# Patient Record
Sex: Male | Born: 1950
Health system: Southern US, Community
[De-identification: ages and names within clinical notes are randomized; demographics above are authoritative.]

## PROBLEM LIST (undated history)

## (undated) DIAGNOSIS — C61 Malignant neoplasm of prostate: Secondary | ICD-10-CM

## (undated) DIAGNOSIS — Z86010 Personal history of colonic polyps: Secondary | ICD-10-CM

## (undated) DIAGNOSIS — I10 Essential (primary) hypertension: Secondary | ICD-10-CM

## (undated) HISTORY — PX: INGUINAL HERNIA REPAIR: SUR1180

## (undated) HISTORY — PX: PROSTATE BIOPSY: SHX241

## (undated) HISTORY — DX: Essential (primary) hypertension: I10

## (undated) HISTORY — PX: COLONOSCOPY W/ POLYPECTOMY: SHX1380

## (undated) HISTORY — DX: Malignant neoplasm of prostate: C61

## (undated) HISTORY — DX: Personal history of colonic polyps: Z86.010

## (undated) HISTORY — PX: TONSILLECTOMY: SUR1361

---

## 2009-08-20 ENCOUNTER — Encounter (INDEPENDENT_AMBULATORY_CARE_PROVIDER_SITE_OTHER): Payer: Self-pay | Admitting: *Deleted

## 2009-08-22 ENCOUNTER — Encounter: Payer: Self-pay | Admitting: Internal Medicine

## 2009-09-13 ENCOUNTER — Ambulatory Visit: Payer: Self-pay | Admitting: Internal Medicine

## 2009-09-13 DIAGNOSIS — Z86010 Personal history of colonic polyps: Secondary | ICD-10-CM

## 2009-09-13 HISTORY — DX: Personal history of colonic polyps: Z86.010

## 2009-09-17 ENCOUNTER — Encounter: Payer: Self-pay | Admitting: Internal Medicine

## 2010-07-23 NOTE — Letter (Signed)
Summary: Gary Reid Instructions  Gary Reid  [address] [place], [place] [address]   Phone: [phone]  Fax: [phone]       Gary Reid    [birth year]    MRN: [id number]        Procedure Day Gary Reid: Gary Reid, 09/13/09     Arrival Time: 10:30 AM      Procedure Time: 11:30 AM     Location of Procedure:                    _X_   [facility] (4th Floor)                       PREPARATION FOR COLONOSCOPY WITH MOVIPREP   Starting 5 days prior to your procedure 09/08/09 do not eat nuts, seeds, popcorn, corn, beans, peas,  salads, or any raw vegetables.  Do not take any fiber supplements (e.g. Metamucil, Citrucel, and Benefiber).  THE DAY BEFORE YOUR PROCEDURE         DATE: 09/12/09       DAY: WEDNESDAY  1.  Drink clear liquids the entire day-NO SOLID FOOD  2.  Do not drink anything colored red or purple.  Avoid juices with pulp.  No orange juice.  3.  Drink at least 64 oz. (8 glasses) of fluid/clear liquids during the day to prevent dehydration and help the prep work efficiently.  CLEAR LIQUIDS INCLUDE: Water Jello Ice Popsicles Tea (sugar ok, no milk/cream) Powdered fruit flavored drinks Coffee (sugar ok, no milk/cream) Gatorade Juice: apple, white grape, white cranberry  Lemonade Clear bullion, consomm, broth Carbonated beverages (any kind) Strained chicken noodle soup Hard Candy                             4.  In the morning, mix first dose of MoviPrep solution:    Empty 1 Pouch A and 1 Pouch B into the disposable container    Add lukewarm drinking water to the top line of the container. Mix to dissolve    Refrigerate (mixed solution should be used within 24 hrs)  5.  Begin drinking the prep at 5:00 p.m. The MoviPrep container is divided by 4 marks.   Every 15 minutes drink the solution down to the next mark (approximately 8 oz) until the full liter is complete.   6.  Follow completed prep with 16 oz of clear liquid of your  choice (Nothing red or purple).  Continue to drink clear liquids until bedtime.  7.  Before going to bed, mix second dose of MoviPrep solution:    Empty 1 Pouch A and 1 Pouch B into the disposable container    Add lukewarm drinking water to the top line of the container. Mix to dissolve    Refrigerate  THE DAY OF YOUR PROCEDURE      DATE: 09/13/09     DAY: THURSDAY  Beginning at 6:30 a.m. (5 hours before procedure):         1. Every 15 minutes, drink the solution down to the next mark (approx 8 oz) until the full liter is complete.  2. Follow completed prep with 16 oz. of clear liquid of your choice.    3. You may drink clear liquids until 9:30 AM (2 HOURS BEFORE PROCEDURE).   MEDICATION INSTRUCTIONS  Unless otherwise instructed, you should take regular prescription medications with a small sip of water  as early as possible the morning of your procedure.                                   OTHER INSTRUCTIONS  You will need a responsible adult at least 62 years of age to accompany you and drive you home.   This person must remain in the waiting room during your procedure.  Wear loose fitting clothing that is easily removed.  Leave jewelry and other valuables at home.  However, you may wish to bring a book to read or  an iPod/MP3 player to listen to music as you wait for your procedure to start.  Remove all body piercing jewelry and leave at home.  Total time from sign-in until discharge is approximately 2-3 hours.  You should go home directly after your procedure and rest.  You can resume normal activities the  day after your procedure.  The day of your procedure you should not:   Drive   Make legal decisions   Operate machinery   Drink alcohol   Return to work  You will receive specific instructions about eating, activities and medications before you leave.    The above instructions have been reviewed and explained to me by    Gary Reid, CMA    I fully understand and can verbalize these instructions _____________________________ Date _________

## 2010-07-23 NOTE — Procedures (Signed)
Summary: Colonoscopy  Patient: Gary Reid Note: All result statuses are Final unless otherwise noted.  Tests: (1) Colonoscopy (COL)   COL Colonoscopy           DONE     [facility]     [address]     [place], [place]  [address]           COLONOSCOPY PROCEDURE REPORT           PATIENT:  [name], [name]  MR#:  [id number]     BIRTHDATE:  Mar 31, 1951, 58 yrs. old  GENDER:  male     ENDOSCOPIST:  Iva Boop, MD, [facility]     REF. BY:  Lupe Carney, M.D.     PROCEDURE DATE:  09/13/2009     PROCEDURE:  Colonoscopy with snare polypectomy     ASA CLASS:  Class II     INDICATIONS:  Routine Risk Screening     MEDICATIONS:   Fentanyl 50 mcg IV, Versed 7 mg IV           DESCRIPTION OF PROCEDURE:   After the risks benefits and     alternatives of the procedure were thoroughly explained, informed     consent was obtained.  Digital rectal exam was performed and     revealed an enlarged prostate.  Mildly enlarged and smooth. The LB     CF-H180AL [id number] endoscope was introduced through the anus and     advanced to the cecum, which was identified by both the appendix     and ileocecal valve, without limitations.  The quality of the prep     was excellent, using MoviPrep.  The instrument was then slowly     withdrawn as the colon was fully examined.     <<PROCEDUREIMAGES>>           FINDINGS:  There were multiple polyps identified and removed. Nine     (9) polyps removed. Size range 3mm to 14-15 mm. Two ascending,     three hepatic flexure, one splenic flexure and three descending     polyps. Polyps > 7mm  were snared, then cauterized with monopolar     cautery. Retrieval was successful. Smaller polyps were snared     without cautery. Retrieval was successful.  This was otherwise a     normal examination of the colon.   Retroflexed views in the rectum     revealed no abnormalities.    The scope was then withdrawn from     the patient and the procedure completed.        Insertion time (including polypectomies) 7:40 minutes Withdrawal     time: 13:48 minutes           COMPLICATIONS:  None           ENDOSCOPIC IMPRESSION:     1) Polyps, multiple (9 removed 3mm to 14-15 mm size)     2) Otherwise normal examination, excellent prep           RECOMMENDATIONS:     1) No aspirin or NSAID's for 2 weeks           REPEAT EXAM:  In for Colonoscopy, pending biopsy results.           Iva Boop, MD, [facility]           CC:  Lupe Carney, MD     The Patient           n.  eSIGNED:   Iva Boop at 09/13/2009 12:10 PM           [name], [id number]  Note: An exclamation mark (!) indicates a result that was not dispersed into the flowsheet. Document Creation Date: 09/13/2009 12:11 PM _______________________________________________________________________  (1) Order result status: Final Collection or observation date-time: 09/13/2009 11:59 Requested date-time:  Receipt date-time:  Reported date-time:  Referring Physician:   Ordering Physician: Stan Head [phone]) Specimen Source:  Source: Launa Grill Order Number: [id number] Lab site:   Appended Document: Colonoscopy     Procedures Next Due Date:    Colonoscopy: 09/2012

## 2010-07-23 NOTE — Letter (Signed)
Summary: Patient Notice- Polyp Results  [facility]  [address] [place], [place] [address]   Phone: [phone]  Fax: [phone]        September 17, 2009 MRN: [id number]    [facility] [address] [place], [place]  [address]    Dear Mr. FAULKNER:  The polyps removed from your colon were adenomatous. This means that they were pre-cancerous or that  they had the potential to change into cancer over time.   I recommend that you have a repeat colonoscopy in 3 years to determine if you have developed any new polyps over time. If you develop any new rectal bleeding, abdominal pain or significant bowel habit changes, please contact us before then.  In addition to repeating colonoscopy, changing health habits may reduce your risk of having more colon polyps and possibly, colon cancer. You may lower your risk of future polyps and colon cancer by adopting healthy habits such as not smoking or using tobacco (if you do), being physically active, losing weight (if overweight), and eating a diet which includes fruits and vegetables and limits red meat.  Please call us if you are having persistent problems or have questions about your condition that have not been fully answered at this time.  Sincerely,  Iva Boop MD, [facility]  This letter has been electronically signed by your physician.  Appended Document: Patient Notice- Polyp Results letter mailed 3.28.11

## 2010-07-23 NOTE — Miscellaneous (Signed)
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 - Signed Rx of MOVIPREP 100 GM  SOLR (PEG-KCL-NACL-NASULF-NA ASC-C) As per prep instructions.;  #1 x 0;  Signed;  Entered by: Francee Piccolo CMA (AAMA);  Authorized by: Iva Boop MD, FACG;  Method used: [facility] [address] [phone]*, [address] [place], [place], [place]  [address], Ph: [phone] or [phone], Fax: [phone] Orders: Added new Test order of Colonoscopy (Colon) - Signed    Prescriptions: MOVIPREP 100 GM  SOLR (PEG-KCL-NACL-NASULF-NA ASC-C) As per prep instructions.  #1 x 0   Entered by:   Francee Piccolo CMA (AAMA)   Authorized by:   Iva Boop MD, [facility]   Signed by:   Francee Piccolo CMA (AAMA) on 08/22/2009   Method used:   [facility]        [facility] [address] [phone]* (retail)       [address]       [place], [place]  [address]       Ph: [phone] or [phone]       Fax: [phone]   RxID:   [id number]

## 2012-08-12 ENCOUNTER — Other Ambulatory Visit (HOSPITAL_COMMUNITY): Payer: Self-pay | Admitting: Urology

## 2012-08-17 ENCOUNTER — Encounter (HOSPITAL_COMMUNITY): Admission: RE | Admit: 2012-08-17 | Payer: 59 | Source: Ambulatory Visit

## 2012-08-20 ENCOUNTER — Ambulatory Visit (HOSPITAL_COMMUNITY)
Admission: RE | Admit: 2012-08-20 | Discharge: 2012-08-20 | Disposition: A | Discharge status: Home / Self Care | Payer: 59 | Source: Ambulatory Visit | Attending: Urology | Admitting: Urology

## 2012-08-20 DIAGNOSIS — C61 Malignant neoplasm of prostate: Secondary | ICD-10-CM | POA: Insufficient documentation

## 2012-08-20 LAB — POCT I-STAT, CHEM 8
BUN: 11 mg/dL (ref 6–23)
Calcium, Ion: 1.23 mmol/L (ref 1.13–1.30)
HCT: 51 % (ref 39.0–52.0)
Sodium: 141 mEq/L (ref 135–145)
TCO2: 30 mmol/L (ref 0–100)

## 2012-08-20 MED ORDER — GADOBENATE DIMEGLUMINE 529 MG/ML IV SOLN
20.0000 mL | Freq: Once | INTRAVENOUS | Status: AC | PRN
  Administered 2012-08-20: 20 mL via INTRAVENOUS

## 2012-08-26 ENCOUNTER — Encounter: Payer: Self-pay | Admitting: Internal Medicine

## 2012-08-27 ENCOUNTER — Encounter: Payer: Self-pay | Admitting: Internal Medicine

## 2012-08-27 DIAGNOSIS — C61 Malignant neoplasm of prostate: Secondary | ICD-10-CM

## 2012-08-27 HISTORY — DX: Malignant neoplasm of prostate: C61

## 2012-09-03 ENCOUNTER — Encounter: Payer: Self-pay | Admitting: Internal Medicine

## 2012-09-07 ENCOUNTER — Ambulatory Visit (AMBULATORY_SURGERY_CENTER): Payer: 59

## 2012-09-07 VITALS — Ht 68.0 in | Wt 232.4 lb

## 2012-09-07 DIAGNOSIS — Z1211 Encounter for screening for malignant neoplasm of colon: Secondary | ICD-10-CM

## 2012-09-07 MED ORDER — NA SULFATE-K SULFATE-MG SULF 17.5-3.13-1.6 GM/177ML PO SOLN: 1.0000 | Freq: Once | ORAL | Status: DC

## 2012-09-14 ENCOUNTER — Encounter: Payer: 59 | Admitting: Internal Medicine

## 2012-09-22 ENCOUNTER — Encounter: Payer: Self-pay | Admitting: Internal Medicine

## 2012-09-22 ENCOUNTER — Ambulatory Visit (AMBULATORY_SURGERY_CENTER): Payer: 59 | Admitting: Internal Medicine

## 2012-09-22 VITALS — BP 119/80 | HR 45 | Temp 98.4°F | Resp 21 | Ht 68.0 in | Wt 232.0 lb

## 2012-09-22 DIAGNOSIS — D126 Benign neoplasm of colon, unspecified: Secondary | ICD-10-CM

## 2012-09-22 DIAGNOSIS — Z1211 Encounter for screening for malignant neoplasm of colon: Secondary | ICD-10-CM

## 2012-09-22 DIAGNOSIS — Z86010 Personal history of colonic polyps: Secondary | ICD-10-CM

## 2012-09-22 MED ORDER — SODIUM CHLORIDE 0.9 % IV SOLN: 500.0000 mL | INTRAVENOUS | Status: DC

## 2012-09-22 NOTE — Progress Notes (Signed)
Patient did not experience any of the following events: a burn prior to discharge; a fall within the facility; wrong site/side/patient/procedure/implant event; or a hospital transfer or hospital admission upon discharge from the facility. (G8907) Patient did not have preoperative order for IV antibiotic SSI prophylaxis. (G8918)  

## 2012-09-22 NOTE — Progress Notes (Signed)
Called to room to assist during endoscopic procedure.  Patient ID and intended procedure confirmed with present staff. Received instructions for my participation in the procedure from the performing physician.  

## 2012-09-22 NOTE — Patient Instructions (Addendum)
I found and removed 7 polyps this time. Largest 8 mm. They all look benign but I think you will need to return again in 3 years for a repeat exam.  I also need to look into the posibiliity of genetic testing for polyposis sybndome and will let you know.  Thank you for choosing me and [facility].  Iva Boop, MD, FACG   YOU HAD AN ENDOSCOPIC PROCEDURE TODAY AT THE [facility]: Refer to the procedure report that was given to you for any specific questions about what was found during the examination.  If the procedure report does not answer your questions, please call your gastroenterologist to clarify.  If you requested that your care partner not be given the details of your procedure findings, then the procedure report has been included in a sealed envelope for you to review at your convenience later.  YOU SHOULD EXPECT: Some feelings of bloating in the abdomen. Passage of more gas than usual.  Walking can help get rid of the air that was put into your GI tract during the procedure and reduce the bloating. If you had a lower endoscopy (such as a colonoscopy or flexible sigmoidoscopy) you may notice spotting of blood in your stool or on the toilet paper. If you underwent a bowel prep for your procedure, then you may not have a normal bowel movement for a few days.  DIET: Your first meal following the procedure should be a light meal and then it is ok to progress to your normal diet.  A half-sandwich or bowl of soup is an example of a good first meal.  Heavy or fried foods are harder to digest and may make you feel nauseous or bloated.  Likewise meals heavy in dairy and vegetables can cause extra gas to form and this can also increase the bloating.  Drink plenty of fluids but you should avoid alcoholic beverages for 24 hours.  ACTIVITY: Your care partner should take you home directly after the procedure.  You should plan to take it easy, moving slowly for the rest of the  day.  You can resume normal activity the day after the procedure however you should NOT DRIVE or use heavy machinery for 24 hours (because of the sedation medicines used during the test).    SYMPTOMS TO REPORT IMMEDIATELY: A gastroenterologist can be reached at any hour.  During normal business hours, 8:30 AM to 5:00 PM Monday through Friday, call [phone].  After hours and on weekends, please call the GI answering service at [phone] who will take a message and have the physician on call contact you.   Following lower endoscopy (colonoscopy or flexible sigmoidoscopy):  Excessive amounts of blood in the stool  Significant tenderness or worsening of abdominal pains  Swelling of the abdomen that is new, acute  Fever of 100F or higher  FOLLOW UP: If any biopsies were taken you will be contacted by phone or by letter within the next 1-3 weeks.  Call your gastroenterologist if you have not heard about the biopsies in 3 weeks.  Our staff will call the home number listed on your records the next business day following your procedure to check on you and address any questions or concerns that you may have at that time regarding the information given to you following your procedure. This is a courtesy call and so if there is no answer at the home number and we have not heard from you through the  emergency physician on call, we will assume that you have returned to your regular daily activities without incident.  SIGNATURES/CONFIDENTIALITY: You and/or your care partner have signed paperwork which will be entered into your electronic medical record.  These signatures attest to the fact that that the information above on your After Visit Summary has been reviewed and is understood.  Full responsibility of the confidentiality of this discharge information lies with you and/or your care-partner.

## 2012-09-22 NOTE — Progress Notes (Signed)
Lidocaine-40mg IV prior to Propofol InductionPropofol given over incremental dosages 

## 2012-09-22 NOTE — Progress Notes (Signed)
NO EGG OR SOY ALLERGY. EWM 

## 2012-09-23 ENCOUNTER — Telehealth: Payer: Self-pay | Admitting: *Deleted

## 2012-09-23 NOTE — Telephone Encounter (Signed)
  Follow up Call-  Call back number 09/22/2012  Post procedure Call Back phone  # [phone]  Permission to leave phone message Yes     Patient questions:  Do you have a fever, pain , or abdominal swelling? no Pain Score  0 *  Have you tolerated food without any problems? yes  Have you been able to return to your normal activities? yes  Do you have any questions about your discharge instructions: Diet   no Medications  no Follow up visit  no  Do you have questions or concerns about your Care? no  Actions: * If pain score is 4 or above: No action needed, pain <4.

## 2012-09-24 NOTE — Op Note (Signed)
[facility] [address] [place], [address]   COLONOSCOPY PROCEDURE REPORT  PATIENT: Gary Reid, Gary Reid  MR#: [id number] BIRTHDATE: 1951-03-06 , 61  yrs. old GENDER: Male ENDOSCOPIST: Iva Boop, MD, [facility] PROCEDURE DATE:  09/22/2012 PROCEDURE:   Colonoscopy with biopsy and snare polypectomy ASA CLASS:   Class II INDICATIONS:Screening and surveillance,personal history of colonic polyps. MEDICATIONS: propofol (Diprivan) 400mg  IV, MAC sedation, administered by CRNA, and These medications were titrated to patient response per physician's verbal order  DESCRIPTION OF PROCEDURE:   After the risks benefits and alternatives of the procedure were thoroughly explained, informed consent was obtained.  A digital rectal exam revealed an enlarged prostate.   The LB CF-Q180AL [id number]  endoscope was introduced through the anus and advanced to the cecum, which was identified by both the appendix and ileocecal valve. No adverse events experienced.   The quality of the prep was Suprep excellent  The instrument was then slowly withdrawn as the colon was fully examined.      COLON FINDINGS: Seven polypoid shaped sessile polyps ranging between 3-38mm in size were found at the cecum (piecemeal snare), in the ascending colon (piecemeal snare x 1, intact snare x 1 and biopsy removal x 2), transverse colon (intact snare x1), and descending colon (bx x 1)  A polypectomy was performed with cold forceps on 3 diminutive polyps and with a cold snare on the others.   The resection was complete and the polyp tissue was completely retrieved.   The colon mucosa was otherwise normal.   A right colon retroflexion was performed.  Retroflexed views revealed no abnormalities. The time to cecum=2 minutes 45 seconds.  Withdrawal time=26 minutes 30 seconds.  The scope was withdrawn and the procedure completed. COMPLICATIONS: There were no complications.   ENDOSCOPIC IMPRESSION: 1.   Seven  sessile polyps ranging between 3-2mm in size were found at the cecum, in the ascending colon, transverse colon, and descending colon; polypectomy was performed with cold forceps and with a cold snare 2.   The colon mucosa was otherwise normal  RECOMMENDATIONS: Timing of repeat colonoscopy will be determined by pathology findings in this patient with 9 adenomas removed 2011 (largest 15 mm).   eSigned:  Iva Boop, MD, [facility] 09/24/2012 8:42 AM Revised: 09/24/2012 8:42 AM  cc: Lupe Carney, MD and The Patient   PATIENT NAME:  Gary Reid, Gary Reid MR#: [id number]

## 2012-09-27 ENCOUNTER — Encounter: Payer: Self-pay | Admitting: Internal Medicine

## 2012-09-27 NOTE — Progress Notes (Signed)
Quick Note:  7 adenomas/sessile serrated adenomas Total of 16 adenomas in 3 years  Will plan for genetic testing, ? Attenuated FAP or other polyposis  To call from office to set this up and will still send letter  1 year recall for now - may change depending upon genetics results ______

## 2012-09-28 ENCOUNTER — Other Ambulatory Visit: Payer: Self-pay

## 2013-09-29 ENCOUNTER — Encounter: Payer: Self-pay | Admitting: Internal Medicine

## 2014-03-09 ENCOUNTER — Encounter: Payer: Self-pay | Admitting: Internal Medicine

## 2014-12-07 ENCOUNTER — Other Ambulatory Visit: Payer: Self-pay | Admitting: Urology

## 2014-12-07 DIAGNOSIS — C61 Malignant neoplasm of prostate: Secondary | ICD-10-CM

## 2014-12-27 ENCOUNTER — Ambulatory Visit (HOSPITAL_COMMUNITY)
Admission: RE | Admit: 2014-12-27 | Discharge: 2014-12-27 | Disposition: A | Discharge status: Home / Self Care | Payer: Medicare Other | Source: Ambulatory Visit | Attending: Urology | Admitting: Urology

## 2014-12-27 DIAGNOSIS — C61 Malignant neoplasm of prostate: Secondary | ICD-10-CM | POA: Insufficient documentation

## 2014-12-27 LAB — POCT I-STAT CREATININE: Creatinine, Ser: 0.9 mg/dL (ref 0.61–1.24)

## 2014-12-27 MED ORDER — GADOBENATE DIMEGLUMINE 529 MG/ML IV SOLN
20.0000 mL | Freq: Once | INTRAVENOUS | Status: AC | PRN
  Administered 2014-12-27: 20 mL via INTRAVENOUS

## 2015-03-06 ENCOUNTER — Other Ambulatory Visit: Payer: Self-pay | Admitting: Internal Medicine

## 2015-03-06 DIAGNOSIS — Z86010 Personal history of colonic polyps: Secondary | ICD-10-CM

## 2015-03-12 ENCOUNTER — Ambulatory Visit (AMBULATORY_SURGERY_CENTER): Payer: Medicare Other | Admitting: Internal Medicine

## 2015-03-12 ENCOUNTER — Encounter: Payer: Self-pay | Admitting: Internal Medicine

## 2015-03-12 VITALS — BP 118/59 | HR 52 | Temp 97.8°F | Resp 72 | Ht 68.0 in | Wt 230.0 lb

## 2015-03-12 DIAGNOSIS — D129 Benign neoplasm of anus and anal canal: Secondary | ICD-10-CM

## 2015-03-12 DIAGNOSIS — D122 Benign neoplasm of ascending colon: Secondary | ICD-10-CM | POA: Diagnosis not present

## 2015-03-12 DIAGNOSIS — K635 Polyp of colon: Secondary | ICD-10-CM | POA: Diagnosis not present

## 2015-03-12 DIAGNOSIS — D128 Benign neoplasm of rectum: Secondary | ICD-10-CM

## 2015-03-12 DIAGNOSIS — K621 Rectal polyp: Secondary | ICD-10-CM

## 2015-03-12 DIAGNOSIS — D123 Benign neoplasm of transverse colon: Secondary | ICD-10-CM

## 2015-03-12 DIAGNOSIS — Z86010 Personal history of colonic polyps: Secondary | ICD-10-CM | POA: Diagnosis present

## 2015-03-12 DIAGNOSIS — D125 Benign neoplasm of sigmoid colon: Secondary | ICD-10-CM

## 2015-03-12 MED ORDER — SODIUM CHLORIDE 0.9 % IV SOLN: 500.0000 mL | INTRAVENOUS | Status: DC

## 2015-03-12 NOTE — Progress Notes (Signed)
Transferred to recovery room. A/O x3, pleased with MAC.  VSS.  Report to Suzanne, RN. 

## 2015-03-12 NOTE — Patient Instructions (Addendum)
I found and removed 8 small polyps. I will let you know pathology results and when to have another routine colonoscopy by mail.  I appreciate the opportunity to care for you. Gatha Mayer, MD, FACG   YOU HAD AN ENDOSCOPIC PROCEDURE TODAY AT [facility]:   Refer to the procedure report that was given to you for any specific questions about what was found during the examination.  If the procedure report does not answer your questions, please call your gastroenterologist to clarify.  If you requested that your care partner not be given the details of your procedure findings, then the procedure report has been included in a sealed envelope for you to review at your convenience later.  YOU SHOULD EXPECT: Some feelings of bloating in the abdomen. Passage of more gas than usual.  Walking can help get rid of the air that was put into your GI tract during the procedure and reduce the bloating. If you had a lower endoscopy (such as a colonoscopy or flexible sigmoidoscopy) you may notice spotting of blood in your stool or on the toilet paper. If you underwent a bowel prep for your procedure, you may not have a normal bowel movement for a few days.  Please Note:  You might notice some irritation and congestion in your nose or some drainage.  This is from the oxygen used during your procedure.  There is no need for concern and it should clear up in a day or so.  SYMPTOMS TO REPORT IMMEDIATELY:   Following lower endoscopy (colonoscopy or flexible sigmoidoscopy):  Excessive amounts of blood in the stool  Significant tenderness or worsening of abdominal pains  Swelling of the abdomen that is new, acute  Fever of 100F or higher   For urgent or emergent issues, a gastroenterologist can be reached at any hour by calling [phone].   DIET: Your first meal following the procedure should be a small meal and then it is ok to progress to your normal diet. Heavy or fried foods are  harder to digest and may make you feel nauseous or bloated.  Likewise, meals heavy in dairy and vegetables can increase bloating.  Drink plenty of fluids but you should avoid alcoholic beverages for 24 hours.  ACTIVITY:  You should plan to take it easy for the rest of today and you should NOT DRIVE or use heavy machinery until tomorrow (because of the sedation medicines used during the test).    FOLLOW UP: Our staff will call the number listed on your records the next business day following your procedure to check on you and address any questions or concerns that you may have regarding the information given to you following your procedure. If we do not reach you, we will leave a message.  However, if you are feeling well and you are not experiencing any problems, there is no need to return our call.  We will assume that you have returned to your regular daily activities without incident.  If any biopsies were taken you will be contacted by phone or by letter within the next 1-3 weeks.  Please call us at [phone] if you have not heard about the biopsies in 3 weeks.    SIGNATURES/CONFIDENTIALITY: You and/or your care partner have signed paperwork which will be entered into your electronic medical record.  These signatures attest to the fact that that the information above on your After Visit Summary has been reviewed and is understood.  Full responsibility of the confidentiality of this discharge information lies with you and/or your care-partner.

## 2015-03-12 NOTE — Progress Notes (Signed)
Called to room to assist during endoscopic procedure.  Patient ID and intended procedure confirmed with present staff. Received instructions for my participation in the procedure from the performing physician.  

## 2015-03-12 NOTE — Op Note (Signed)
[place]  [address] [place], [id number]   COLONOSCOPY PROCEDURE REPORT  PATIENT: Gary Reid, Gary Reid  MR#: [id number] BIRTHDATE: Feb 04, 1951 , 55  yrs. old GENDER: male ENDOSCOPIST: Gatha Mayer, MD, [facility] PROCEDURE DATE:  03/12/2015 PROCEDURE:   Colonoscopy, surveillance and Colonoscopy with snare polypectomy First Screening Colonoscopy - Avg.  risk and is 50 yrs.  old or older - No.  Prior Negative Screening - Now for repeat screening. N/A  History of Adenoma - Now for follow-up colonoscopy & has been > or = to 3 yrs.  Yes hx of adenoma.  Has been 3 or more years since last colonoscopy.  Polyps removed today? Yes ASA CLASS:   Class II INDICATIONS:Surveillance due to prior colonic neoplasia and PH Colon Adenoma. MEDICATIONS: Propofol 325 mg IV and Monitored anesthesia care DESCRIPTION OF PROCEDURE:   After the risks benefits and alternatives of the procedure were thoroughly explained, informed consent was obtained.  The digital rectal exam revealed no rectal mass and revealed an enlarged prostate.   The [id number] endoscope was introduced through the anus and advanced to the cecum, which was identified by both the appendix and ileocecal valve. No adverse events experienced.   The quality of the prep was good.  (MiraLax was used)  The instrument was then slowly withdrawn as the colon was fully examined. Estimated blood loss is zero unless otherwise noted in this procedure report.  COLON FINDINGS: Eight polypoid shaped sessile polyps ranging from 2 to 62mm in size were found in the transverse colon, ascending colon, rectum, and sigmoid colon.  Polypectomies were performed with a cold snare.  The resection was complete, the polyp tissue was completely retrieved and sent to histology.   The examination was otherwise normal.  Retroflexed views revealed no abnormalities. The time to cecum = 1.3 Withdrawal time = 17.2   The scope was withdrawn and the  procedure completed. COMPLICATIONS: There were no immediate complications. ENDOSCOPIC IMPRESSION: 1.   Eight sessile polyps ranging from 2 to 68mm in size were found in the transverse colon, ascending colon, rectum, and sigmoid colon; polypectomies were performed with a cold snare 2.   The examination was otherwise normal - hx numerous adenomas since intital screening  RECOMMENDATIONS: Timing of repeat colonoscopy will be determined by pathology findings. eSigned:  Gatha Mayer, MD, [facility] 03/12/2015 8:40 AM revcc: The Patient  and Donnie Coffin, MD

## 2015-03-13 ENCOUNTER — Telehealth: Payer: Self-pay | Admitting: *Deleted

## 2015-03-13 NOTE — Telephone Encounter (Signed)
  Follow up Call-  Call back number 03/12/2015 09/22/2012  Post procedure Call Back phone  # [phone]  Permission to leave phone message Yes Yes     Patient questions:  Do you have a fever, pain , or abdominal swelling? No. Pain Score  0 *  Have you tolerated food without any problems? Yes.    Have you been able to return to your normal activities? Yes.    Do you have any questions about your discharge instructions: Diet   No. Medications  No. Follow up visit  No.  Do you have questions or concerns about your Care? No.  Actions: * If pain score is 4 or above: No action needed, pain <4.

## 2015-03-20 ENCOUNTER — Encounter: Payer: Self-pay | Admitting: Internal Medicine

## 2015-03-20 DIAGNOSIS — Z86010 Personal history of colonic polyps: Secondary | ICD-10-CM

## 2015-03-20 NOTE — Progress Notes (Signed)
Quick Note:  3 adenomas and an ssp Repeat colonoscopy 2019 ______

## 2015-07-30 MED FILL — VERAPAMIL ER 240 MG TABLET: 240 | 90 days supply | Qty: 90 | Fill #1

## 2015-08-23 MED FILL — METOPROLOL TARTRATE 100 MG: 100 | 90 days supply | Qty: 90 | Fill #0

## 2015-09-12 MED FILL — LOSARTAN-HCTZ 100-25 MG TAB: 100-25 | 90 days supply | Qty: 90 | Fill #2

## 2015-10-25 MED FILL — VERAPAMIL ER 240 MG TABLET: 240 | 90 days supply | Qty: 90 | Fill #0

## 2015-11-20 MED FILL — METOPROLOL TARTRATE 100 MG: 100 | 90 days supply | Qty: 90 | Fill #1

## 2015-12-17 MED FILL — LOSARTAN-HCTZ 100-25 MG TAB: 100-25 | 90 days supply | Qty: 90 | Fill #3

## 2016-01-25 MED FILL — VERAPAMIL ER 240 MG TABLET: 240 | 90 days supply | Qty: 90 | Fill #1

## 2016-02-19 MED FILL — METOPROLOL TARTRATE 100 MG: 100 | 90 days supply | Qty: 90 | Fill #0

## 2016-03-13 MED FILL — LOSARTAN-HCTZ 100-25 MG TAB: 100-25 | 90 days supply | Qty: 90 | Fill #0

## 2016-04-24 MED FILL — VERAPAMIL ER 240 MG TABLET: 240 | 90 days supply | Qty: 90 | Fill #0

## 2016-05-19 MED FILL — METOPROLOL TARTRATE 100 MG: 100 | 90 days supply | Qty: 90 | Fill #1

## 2016-06-11 MED FILL — LOSARTAN-HCTZ 100-25 MG TAB: 100-25 | 90 days supply | Qty: 90 | Fill #1

## 2016-07-25 MED FILL — VERAPAMIL ER 240 MG TABLET: 240 | 90 days supply | Qty: 90 | Fill #1

## 2016-08-19 MED FILL — METOPROLOL TARTRATE 100 MG: 100 | 90 days supply | Qty: 90 | Fill #2

## 2016-09-11 MED FILL — LOSARTAN-HCTZ 100-25 MG TAB: 100-25 | 90 days supply | Qty: 90 | Fill #2

## 2016-10-21 MED FILL — VERAPAMIL ER 240 MG TABLET: 240 | 90 days supply | Qty: 90 | Fill #0

## 2016-11-19 MED FILL — METOPROLOL TARTRATE 100 MG: 100 | 90 days supply | Qty: 90 | Fill #3

## 2016-12-16 MED FILL — LOSARTAN-HCTZ 100-25 MG TAB: 100-25 | 90 days supply | Qty: 90 | Fill #3

## 2017-01-27 MED FILL — VERAPAMIL ER 240 MG TABLET: 240 | 90 days supply | Qty: 90 | Fill #1

## 2017-02-24 MED FILL — METOPROLOL TARTRATE 100 MG: 100 | 90 days supply | Qty: 90 | Fill #0

## 2017-03-19 MED FILL — LOSARTAN-HCTZ 100-25 MG TAB: 100-25 | 90 days supply | Qty: 90 | Fill #0

## 2017-05-04 MED FILL — VERAPAMIL ER 240 MG TABLET: 240 | 90 days supply | Qty: 90 | Fill #0

## 2017-05-20 MED FILL — METOPROLOL TARTRATE 100 MG: 100 | 90 days supply | Qty: 90 | Fill #1

## 2017-06-15 ENCOUNTER — Other Ambulatory Visit: Payer: Self-pay | Admitting: Urology

## 2017-06-15 DIAGNOSIS — C61 Malignant neoplasm of prostate: Secondary | ICD-10-CM

## 2017-06-22 MED FILL — LOSARTAN-HCTZ 100-25 MG TAB: 100-25 | 90 days supply | Qty: 90 | Fill #1

## 2017-07-17 ENCOUNTER — Ambulatory Visit
Admission: RE | Admit: 2017-07-17 | Discharge: 2017-07-17 | Disposition: A | Discharge status: Home / Self Care | Payer: Medicare Other | Source: Ambulatory Visit | Attending: Urology | Admitting: Urology

## 2017-07-17 DIAGNOSIS — C61 Malignant neoplasm of prostate: Secondary | ICD-10-CM

## 2017-07-17 MED ORDER — GADOBENATE DIMEGLUMINE 529 MG/ML IV SOLN
20.0000 mL | Freq: Once | INTRAVENOUS | Status: AC | PRN
  Administered 2017-07-17: 20 mL via INTRAVENOUS

## 2017-07-28 MED FILL — METOPROLOL TARTRATE 100 MG: 100 | 90 days supply | Qty: 90 | Fill #2

## 2017-08-03 MED FILL — VERAPAMIL ER 240 MG TABLET: 240 | 90 days supply | Qty: 90 | Fill #1

## 2017-09-23 MED FILL — LOSARTAN-HCTZ 100-25 MG TAB: 100-25 | 90 days supply | Qty: 90 | Fill #2

## 2017-10-28 MED FILL — METOPROLOL TARTRATE 100 MG: 100 | 90 days supply | Qty: 90 | Fill #3

## 2017-10-28 MED FILL — VERAPAMIL ER 240 MG TABLET: 240 | 90 days supply | Qty: 90 | Fill #0

## 2017-11-26 MED FILL — SILDENAFIL CITRATE 50 MG TA: 50 | 30 days supply | Qty: 4 | Fill #0

## 2017-12-21 MED FILL — LOSARTAN-HCTZ 100-25 MG TAB: 100-25 | 90 days supply | Qty: 90 | Fill #3

## 2018-01-28 MED FILL — METOPROLOL TARTRATE 100 MG: 100 | 90 days supply | Qty: 90 | Fill #0

## 2018-02-01 MED FILL — VERAPAMIL ER 240 MG TABLET: 240 | 90 days supply | Qty: 90 | Fill #1

## 2018-04-29 MED FILL — VERAPAMIL ER 240 MG TABLET: 240 | 90 days supply | Qty: 90 | Fill #0

## 2018-04-29 MED FILL — METOPROLOL TARTRATE 100 MG: 100 | 90 days supply | Qty: 90 | Fill #1

## 2018-06-04 MED FILL — levoFLOXacin 750 MG TABS: 750 | 1 days supply | Qty: 1 | Fill #0

## 2018-06-17 MED FILL — LOSARTAN-HCTZ 100-25 MG TAB: 100-25 | 90 days supply | Qty: 90 | Fill #1

## 2018-06-28 ENCOUNTER — Encounter: Payer: Self-pay | Admitting: Internal Medicine

## 2018-07-10 ENCOUNTER — Ambulatory Visit: Payer: Self-pay | Admitting: Nurse Practitioner

## 2018-07-10 VITALS — BP 128/80 | HR 65 | Temp 100.8°F | Resp 20 | Wt 233.8 lb

## 2018-07-10 DIAGNOSIS — R6889 Other general symptoms and signs: Secondary | ICD-10-CM

## 2018-07-10 DIAGNOSIS — R509 Fever, unspecified: Secondary | ICD-10-CM

## 2018-07-10 LAB — POCT INFLUENZA A/B
Influenza A, POC: NEGATIVE
Influenza B, POC: NEGATIVE

## 2018-07-10 MED ORDER — OSELTAMIVIR PHOSPHATE 75 MG PO CAPS: 75.0000 mg | ORAL_CAPSULE | Freq: Two times a day (BID) | ORAL | 0 refills | Status: AC

## 2018-07-10 MED ORDER — PROMETHAZINE-DM 6.25-15 MG/5ML PO SYRP: 5.0000 mL | ORAL_SOLUTION | Freq: Four times a day (QID) | ORAL | 0 refills | Status: AC | PRN

## 2018-07-10 MED ORDER — BENZONATATE 200 MG PO CAPS: 200.0000 mg | ORAL_CAPSULE | Freq: Three times a day (TID) | ORAL | 0 refills | Status: AC | PRN

## 2018-07-10 NOTE — Patient Instructions (Signed)
Influenza-Like Symptoms, Adult -Take medication as prescribed. -Tylenol ES 500mg , take 1-2 tablets for pain, fever, or general discomfort. -Increase fluids. -Get plenty of rest. -Sleep elevated on at least 2 pillows at bedtime to help with cough. -Use a humidifier or vaporizer when at home and during sleep to help with cough. -May use a teaspoon of honey or over-the-counter cough drops to help with cough. -Remain home until fever-free for 24 hours. -Follow-up with your regular doctor if symptoms do not improve.  Influenza, more commonly known as "the flu," is a viral infection that mainly affects the respiratory tract. The respiratory tract includes organs that help you breathe, such as the lungs, nose, and throat. The flu causes many symptoms similar to the common cold along with high fever and body aches. The flu spreads easily from person to person (is contagious). Getting a flu shot (influenza vaccination) every year is the best way to prevent the flu. What are the causes? This condition is caused by the influenza virus. You can get the virus by:  Breathing in droplets that are in the air from an infected person's cough or sneeze.  Touching something that has been exposed to the virus (has been contaminated) and then touching your mouth, nose, or eyes. What increases the risk? The following factors may make you more likely to get the flu:  Not washing or sanitizing your hands often.  Having close contact with many people during cold and flu season.  Touching your mouth, eyes, or nose without first washing or sanitizing your hands.  Not getting a yearly (annual) flu shot. You may have a higher risk for the flu, including serious problems such as a lung infection (pneumonia), if you:  Are older than 65.  Are pregnant.  Have a weakened disease-fighting system (immune system). You may have a weakened immune system if you: ? Have HIV or AIDS. ? Are undergoing chemotherapy. ? Are  taking medicines that reduce (suppress) the activity of your immune system.  Have a long-term (chronic) illness, such as heart disease, kidney disease, diabetes, or lung disease.  Have a liver disorder.  Are severely overweight (morbidly obese).  Have anemia. This is a condition that affects your red blood cells.  Have asthma. What are the signs or symptoms? Symptoms of this condition usually begin suddenly and last 4-14 days. They may include:  Fever and chills.  Headaches, body aches, or muscle aches.  Sore throat.  Cough.  Runny or stuffy (congested) nose.  Chest discomfort.  Poor appetite.  Weakness or fatigue.  Dizziness.  Nausea or vomiting. How is this diagnosed? This condition may be diagnosed based on:  Your symptoms and medical history.  A physical exam.  Swabbing your nose or throat and testing the fluid for the influenza virus. How is this treated? If the flu is diagnosed early, you can be treated with medicine that can help reduce how severe the illness is and how long it lasts (antiviral medicine). This may be given by mouth (orally) or through an IV. Taking care of yourself at home can help relieve symptoms. Your health care provider may recommend:  Taking over-the-counter medicines.  Drinking plenty of fluids. In many cases, the flu goes away on its own. If you have severe symptoms or complications, you may be treated in a hospital. Follow these instructions at home: Activity  Rest as needed and get plenty of sleep.  Stay home from work or school as told by your health care provider. Unless  you are visiting your health care provider, avoid leaving home until your fever has been gone for 24 hours without taking medicine. Eating and drinking  Take an oral rehydration solution (ORS). This is a drink that is sold at pharmacies and retail stores.  Drink enough fluid to keep your urine pale yellow.  Drink clear fluids in small amounts as you are  able. Clear fluids include water, ice chips, diluted fruit juice, and low-calorie sports drinks.  Eat bland, easy-to-digest foods in small amounts as you are able. These foods include bananas, applesauce, rice, lean meats, toast, and crackers.  Avoid drinking fluids that contain a lot of sugar or caffeine, such as energy drinks, regular sports drinks, and soda.  Avoid alcohol.  Avoid spicy or fatty foods. General instructions      Take over-the-counter and prescription medicines only as told by your health care provider.  Use a cool mist humidifier to add humidity to the air in your home. This can make it easier to breathe.  Cover your mouth and nose when you cough or sneeze.  Wash your hands with soap and water often, especially after you cough or sneeze. If soap and water are not available, use alcohol-based hand sanitizer.  Keep all follow-up visits as told by your health care provider. This is important. How is this prevented?   Get an annual flu shot. You may get the flu shot in late summer, fall, or winter. Ask your health care provider when you should get your flu shot.  Avoid contact with people who are sick during cold and flu season. This is generally fall and winter. Contact a health care provider if:  You develop new symptoms.  You have: ? Chest pain. ? Diarrhea. ? A fever.  Your cough gets worse.  You produce more mucus.  You feel nauseous or you vomit. Get help right away if:  You develop shortness of breath or difficulty breathing.  Your skin or nails turn a bluish color.  You have severe pain or stiffness in your neck.  You develop a sudden headache or sudden pain in your face or ear.  You cannot eat or drink without vomiting. Summary  Influenza, more commonly known as "the flu," is a viral infection that primarily affects your respiratory tract.  Symptoms of the flu usually begin suddenly and last 4-14 days.  Getting an annual flu shot is  the best way to prevent getting the flu.  Stay home from work or school as told by your health care provider. Unless you are visiting your health care provider, avoid leaving home until your fever has been gone for 24 hours without taking medicine.  Keep all follow-up visits as told by your health care provider. This is important. This information is not intended to replace advice given to you by your health care provider. Make sure you discuss any questions you have with your health care provider. Document Released: 06/06/2000 Document Revised: 11/25/2017 Document Reviewed: 11/25/2017 Elsevier Interactive Patient Education  2019 [employer].

## 2018-07-10 NOTE — Progress Notes (Signed)
Subjective:     Gary Reid is a 68 y.o. male who presents for evaluation of influenza like symptoms. Symptoms include suspected fevers but not measured at home, chills, myalgias, productive cough and fatigue and have been present for 1 day. He has tried to alleviate the symptoms with acetaminophen with moderate relief. High risk factors for influenza complications: age greater than 45 years of age.  Patient states he has had an influenza vaccine this season.  Patient does have a history of HTN.  The following portions of the patient's history were reviewed and updated as appropriate: allergies, current medications and past medical history.  Review of Systems Constitutional: positive for chills, fatigue, fevers and malaise, negative for anorexia and sweats Eyes: negative Ears, nose, mouth, throat, and face: positive for nasal congestion and sore throat, negative for ear drainage, earaches and hoarseness Respiratory: positive for cough and sputum, negative for asthma, chronic bronchitis, dyspnea on exertion, pleurisy/chest pain, stridor and wheezing Cardiovascular: negative Gastrointestinal: negative Neurological: positive for headaches, negative for coordination problems, dizziness, gait problems, paresthesia, vertigo and weakness     Objective:    BP 128/80 (BP Location: Right Arm, Patient Position: Sitting, Cuff Size: Large)   Pulse 65   Temp (!) 100.8 F (38.2 C) (Oral)   Resp 20   Wt 233 lb 12.8 oz (106.1 kg)   SpO2 96%   BMI 35.55 kg/m     Physical Exam Vitals signs reviewed.  Constitutional:      Appearance: Normal appearance.     Comments: Appears uncomfortable  HENT:     Head: Normocephalic.     Right Ear: Tympanic membrane and ear canal normal.     Left Ear: Tympanic membrane and ear canal normal.     Nose: Congestion (moderate) present.     Mouth/Throat:     Lips: Pink.     Mouth: Mucous membranes are moist.     Pharynx: Uvula midline. Posterior oropharyngeal  erythema (moderate oropharyngeal erythmea) present. No oropharyngeal exudate or uvula swelling.     Tonsils: No tonsillar exudate. Swelling: 0 on the right. 0 on the left.  Eyes:     Pupils: Pupils are equal, round, and reactive to light.  Neck:     Musculoskeletal: Normal range of motion and neck supple. No neck rigidity or muscular tenderness.  Cardiovascular:     Rate and Rhythm: Normal rate and regular rhythm.     Pulses: Normal pulses.     Heart sounds: Normal heart sounds.  Pulmonary:     Effort: Pulmonary effort is normal. No respiratory distress.     Breath sounds: Normal breath sounds. No wheezing, rhonchi or rales.  Abdominal:     General: Abdomen is flat. There is no distension.     Tenderness: There is no abdominal tenderness.  Lymphadenopathy:     Cervical: Cervical adenopathy (bilateral superficial cervical adenopathy) present.  Skin:    General: Skin is warm and dry.     Capillary Refill: Capillary refill takes 2 to 3 seconds.  Neurological:     General: No focal deficit present.     Mental Status: He is alert and oriented to person, place, and time.     Cranial Nerves: No cranial nerve deficit.  Psychiatric:        Mood and Affect: Mood normal.        Thought Content: Thought content normal.      Assessment:    Influenza like syndrome    Plan:  Exam findings, diagnosis etiology and medication use and indications reviewed with patient. Follow- Up and discharge instructions provided. No emergent/urgent issues found on exam.  Based on the patient's clinical presentation, sudden onset of symptoms, physical assessment, and advanced age, I am going to go ahead and treat the patient with Tamiflu for his influenza-like symptoms.  Not displaying any distress, and vital signs are stable at this time.  Patient was also provided symptomatic treatment for his cough.  Patient was instructed to remain home until fever free for 24 hours and to continue administration of Tylenol  for pain, fever, or general discomfort.  Reviewed the indications of pneumonia with patient and indications to return or follow-up with his PCP.  Patient education was provided. Patient verbalized understanding of information provided and agrees with plan of care (POC), all questions answered. The patient is advised to call or return to clinic if condition does not see an improvement in symptoms, or to seek the care of the closest emergency department if condition worsens with the above plan.   1. Fever, unspecified fever cause  - POCT Influenza A/B-negative  2. Influenza-like symptoms  - oseltamivir (TAMIFLU) 75 MG capsule; Take 1 capsule (75 mg total) by mouth 2 (two) times daily for 5 days.  Dispense: 10 capsule; Refill: 0 - benzonatate (TESSALON) 200 MG capsule; Take 1 capsule (200 mg total) by mouth 3 (three) times daily as needed for up to 10 days for cough.  Dispense: 30 capsule; Refill: 0 - promethazine-dextromethorphan (PROMETHAZINE-DM) 6.25-15 MG/5ML syrup; Take 5 mLs by mouth 4 (four) times daily as needed for up to 7 days.  Dispense: 140 mL; Refill: 0 -Take medication as prescribed. -Tylenol ES 500mg , take 1-2 tablets for pain, fever, or general discomfort. -Increase fluids. -Get plenty of rest. -Sleep elevated on at least 2 pillows at bedtime to help with cough. -Use a humidifier or vaporizer when at home and during sleep to help with cough. -May use a teaspoon of honey or over-the-counter cough drops to help with cough. -Remain home until fever-free for 24 hours. -Follow-up with your regular doctor if symptoms do not improve.

## 2018-07-28 MED FILL — VERAPAMIL ER 240 MG TABLET: 240 | 90 days supply | Qty: 90 | Fill #1 | Status: TO

## 2018-07-28 MED FILL — METOPROLOL TARTRATE 100 MG: 100 | 90 days supply | Qty: 90 | Fill #0 | Status: TO

## 2018-08-25 ENCOUNTER — Telehealth: Payer: Self-pay

## 2018-08-25 DIAGNOSIS — Z86010 Personal history of colonic polyps: Secondary | ICD-10-CM

## 2018-08-25 NOTE — Telephone Encounter (Signed)
Colonoscopy appointment on 09/15/2018 at 8:00AM. Patient given instructions and signed consent.

## 2018-09-13 MED FILL — LOSARTAN-HCTZ 100-25 MG TAB: 100-25 | 30 days supply | Qty: 30 | Fill #0

## 2018-09-15 ENCOUNTER — Encounter: Payer: Medicare Other | Admitting: Internal Medicine

## 2018-10-13 MED FILL — LOSARTAN-HCTZ 100-25 MG TAB: 100-25 | 30 days supply | Qty: 30 | Fill #1

## 2018-10-19 ENCOUNTER — Telehealth: Payer: Self-pay | Admitting: *Deleted

## 2018-10-19 NOTE — Telephone Encounter (Signed)
Called patient to reschedule previously cancelled procedure due to Covid-19. Pt wishes to wait a little longer before scheduling and would like to be called on the next round of calls. Pt will need a PV as well.

## 2018-10-20 MED FILL — VERAPAMIL HCL ER 240 MG TBC: 240 | 90 days supply | Qty: 90 | Fill #0

## 2018-10-23 MED FILL — METOPROLOL TARTRATE 100 MG: 100 | 90 days supply | Qty: 90 | Fill #0

## 2018-11-01 ENCOUNTER — Telehealth: Payer: Self-pay | Admitting: *Deleted

## 2018-11-01 NOTE — Telephone Encounter (Signed)
Instructions sent via MyChart.

## 2018-11-01 NOTE — Telephone Encounter (Signed)
Spoke with patient.  Reschedule procedure for 6/8 @ 8:00.  Will send new prep instructions via My Chart.

## 2018-11-09 MED FILL — LOSARTAN-HCTZ 100-25 MG TAB: 100-25 | 30 days supply | Qty: 30 | Fill #2

## 2018-11-26 ENCOUNTER — Telehealth: Payer: Self-pay | Admitting: *Deleted

## 2018-11-26 NOTE — Telephone Encounter (Signed)
Covid-19 screening questions  Have you traveled in the last 14 days? No. If yes where?  Do you now or have you had a fever in the last 14 days? No.  Do you have any respiratory symptoms of shortness of breath or cough now or in the last 14 days? No.  Do you have any family members or close contacts with diagnosed or suspected Covid-19 in the past 14 days? No.  Have you been tested for Covid-19 and found to be positive? No.       

## 2018-11-29 ENCOUNTER — Ambulatory Visit (AMBULATORY_SURGERY_CENTER): Payer: Medicare Other | Admitting: Internal Medicine

## 2018-11-29 ENCOUNTER — Other Ambulatory Visit: Payer: Self-pay

## 2018-11-29 ENCOUNTER — Encounter: Payer: Self-pay | Admitting: Internal Medicine

## 2018-11-29 VITALS — BP 129/72 | HR 42 | Temp 98.7°F | Resp 25 | Ht 68.0 in | Wt 233.0 lb

## 2018-11-29 DIAGNOSIS — K635 Polyp of colon: Secondary | ICD-10-CM

## 2018-11-29 DIAGNOSIS — D122 Benign neoplasm of ascending colon: Secondary | ICD-10-CM

## 2018-11-29 DIAGNOSIS — Z86010 Personal history of colonic polyps: Secondary | ICD-10-CM

## 2018-11-29 MED ORDER — SODIUM CHLORIDE 0.9 % IV SOLN: 500.0000 mL | Freq: Once | INTRAVENOUS | Status: DC

## 2018-11-29 NOTE — Progress Notes (Signed)
No problems noted in the recovery room. maw 

## 2018-11-29 NOTE — Progress Notes (Signed)
Called to room to assist during endoscopic procedure.  Patient ID and intended procedure confirmed with present staff. Received instructions for my participation in the procedure from the performing physician.  

## 2018-11-29 NOTE — Op Note (Signed)
[place] Patient Name: Gary Reid Procedure Date: 11/29/2018 8:13 AM MRN: [id number] Endoscopist: Gatha Mayer , MD Age: 68 Referring MD:  Date of Birth: [birth year] Gender: Male Account #: [id number] Procedure:                Colonoscopy Indications:              Surveillance: Personal history of adenomatous                            polyps on last colonoscopy > 3 years ago Medicines:                Propofol per Anesthesia, Monitored Anesthesia Care Procedure:                Pre-Anesthesia Assessment:                           - Prior to the procedure, a History and Physical                            was performed, and patient medications and                            allergies were reviewed. The patient's tolerance of                            previous anesthesia was also reviewed. The risks                            and benefits of the procedure and the sedation                            options and risks were discussed with the patient.                            All questions were answered, and informed consent                            was obtained. Prior Anticoagulants: The patient has                            taken no previous anticoagulant or antiplatelet                            agents. ASA Grade Assessment: II - A patient with                            mild systemic disease. After reviewing the risks                            and benefits, the patient was deemed in                            satisfactory condition to undergo the procedure.  After obtaining informed consent, the colonoscope                            was passed under direct vision. Throughout the                            procedure, the patient's blood pressure, pulse, and                            oxygen saturations were monitored continuously. The                            Colonoscope was introduced through the anus and   advanced to the the cecum, identified by                            appendiceal orifice and ileocecal valve. The                            colonoscopy was performed without difficulty. The                            patient tolerated the procedure well. The quality                            of the bowel preparation was good. The bowel                            preparation used was Miralax via split dose                            instruction. The ileocecal valve, appendiceal                            orifice, and rectum were photographed. Scope In: 8:14:39 AM Scope Out: 8:27:46 AM Scope Withdrawal Time: 0 hours 11 minutes 21 seconds  Total Procedure Duration: 0 hours 13 minutes 7 seconds  Findings:                 The perianal and digital rectal examinations were                            normal.                           Two sessile polyps were found in the distal sigmoid                            colon and ascending colon. The polyps were                            diminutive in size. These polyps were removed with                            a cold snare. Resection and retrieval  were                            complete. Verification of patient identification                            for the specimen was done. Estimated blood loss was                            minimal.                           The exam was otherwise without abnormality on                            direct and retroflexion views. Complications:            No immediate complications. Estimated Blood Loss:     Estimated blood loss was minimal. Impression:               - Two diminutive polyps in the distal sigmoid colon                            and in the ascending colon, removed with a cold                            snare. Resected and retrieved.                           - The examination was otherwise normal on direct                            and retroflexion views.                           - Personal  history of colonic polyps. 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 The signs and symptoms of potential                            delayed complications were discussed with the                            patient. Return to normal activities tomorrow.                            Written discharge instructions were provided to the                            patient.                           -  Resume previous diet.                           - Continue present medications.                           - Repeat colonoscopy is recommended for                            surveillance. The colonoscopy date will be                            determined after pathology results from today's                            exam become available for review. Gatha Mayer, MD 11/29/2018 8:37:33 AM This report has been signed electronically.

## 2018-11-29 NOTE — Patient Instructions (Addendum)
Only 2 tiny polyps today! I will let you know pathology results and when to have another routine colonoscopy by mail and/or My Chart.  I appreciate the opportunity to care for you. Gatha Mayer, MD, FACG    YOU HAD AN ENDOSCOPIC PROCEDURE TODAY AT [facility]:   Refer to the procedure report that was given to you for any specific questions about what was found during the examination.  If the procedure report does not answer your questions, please call your gastroenterologist to clarify.  If you requested that your care partner not be given the details of your procedure findings, then the procedure report has been included in a sealed envelope for you to review at your convenience later.  YOU SHOULD EXPECT: Some feelings of bloating in the abdomen. Passage of more gas than usual.  Walking can help get rid of the air that was put into your GI tract during the procedure and reduce the bloating. If you had a lower endoscopy (such as a colonoscopy or flexible sigmoidoscopy) you may notice spotting of blood in your stool or on the toilet paper. If you underwent a bowel prep for your procedure, you may not have a normal bowel movement for a few days.  Please Note:  You might notice some irritation and congestion in your nose or some drainage.  This is from the oxygen used during your procedure.  There is no need for concern and it should clear up in a day or so.  SYMPTOMS TO REPORT IMMEDIATELY:   Following lower endoscopy (colonoscopy or flexible sigmoidoscopy):  Excessive amounts of blood in the stool  Significant tenderness or worsening of abdominal pains  Swelling of the abdomen that is new, acute  Fever of 100F or higher   For urgent or emergent issues, a gastroenterologist can be reached at any hour by calling [phone].   DIET:  We do recommend a small meal at first, but then you may proceed to your regular diet.  Drink plenty of fluids but you should  avoid alcoholic beverages for 24 hours.  ACTIVITY:  You should plan to take it easy for the rest of today and you should NOT DRIVE or use heavy machinery until tomorrow (because of the sedation medicines used during the test).    FOLLOW UP: Our staff will call the number listed on your records 48-72 hours following your procedure to check on you and address any questions or concerns that you may have regarding the information given to you following your procedure. If we do not reach you, we will leave a message.  We will attempt to reach you two times.  During this call, we will ask if you have developed any symptoms of COVID 19. If you develop any symptoms (ie: fever, flu-like symptoms, shortness of breath, cough etc.) before then, please call [phone].  If you test positive for Covid 19 in the 2 weeks post procedure, please call and report this information to [place].    If any biopsies were taken you will be contacted by phone or by letter within the next 1-3 weeks.  Please call us at [phone] if you have not heard about the biopsies in 3 weeks.    SIGNATURES/CONFIDENTIALITY: You and/or your care partner have signed paperwork which will be entered into your electronic medical record.  These signatures attest to the fact that that the information above on your After Visit Summary has been reviewed and is understood.  Full responsibility of the confidentiality of this discharge information lies with you and/or your care-partner.   Handout was given to your care partner on polyps. You may resume your current medications today. Await biopsy results. Please call if any questions or concerns.

## 2018-11-29 NOTE — Progress Notes (Signed)
Report given to PACU, vss 

## 2018-12-01 ENCOUNTER — Telehealth: Payer: Self-pay

## 2018-12-01 NOTE — Telephone Encounter (Signed)
  Follow up Call-  Call back number 11/29/2018  Post procedure Call Back phone  # [phone]  Permission to leave phone message Yes  Some recent data might be hidden     Patient questions:  Do you have a fever, pain , or abdominal swelling? No. Pain Score  0 *  Have you tolerated food without any problems? Yes.    Have you been able to return to your normal activities? Yes.    Do you have any questions about your discharge instructions: Diet   No. Medications  No. Follow up visit  No.  Do you have questions or concerns about your Care? No.  Actions: * If pain score is 4 or above: No action needed, pain <4.

## 2018-12-06 ENCOUNTER — Other Ambulatory Visit: Payer: Self-pay

## 2018-12-06 ENCOUNTER — Encounter: Payer: Self-pay | Admitting: Internal Medicine

## 2018-12-06 DIAGNOSIS — Z86018 Personal history of other benign neoplasm: Secondary | ICD-10-CM

## 2018-12-06 DIAGNOSIS — Z86010 Personal history of colonic polyps: Secondary | ICD-10-CM

## 2018-12-06 DIAGNOSIS — Z87438 Personal history of other diseases of male genital organs: Secondary | ICD-10-CM

## 2018-12-06 NOTE — Progress Notes (Signed)
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

## 2018-12-09 ENCOUNTER — Telehealth: Payer: Self-pay | Admitting: Licensed Clinical Social Worker

## 2018-12-09 ENCOUNTER — Encounter: Payer: Self-pay | Admitting: Licensed Clinical Social Worker

## 2018-12-09 NOTE — Telephone Encounter (Signed)
Pt cld back to confirm appt w/Brianna on 7/9 at 2pm.

## 2018-12-09 NOTE — Telephone Encounter (Signed)
Received a genetic counseling referral from Dr. Carlean Purl for personl hx of colon polpys. I cld Gary Reid to schedule but received his vm. I scheduled him to see Faith Rogue on 7/9 at 2pm. Lft the appt date and time on the pt's vm. Letter mailed.

## 2018-12-14 MED FILL — LOSARTAN-HCTZ 100-25 MG TAB: 100-25 | 30 days supply | Qty: 30 | Fill #3

## 2018-12-30 ENCOUNTER — Inpatient Hospital Stay: Payer: Medicare Other | Admitting: Licensed Clinical Social Worker

## 2018-12-30 ENCOUNTER — Inpatient Hospital Stay: Payer: Medicare Other

## 2019-01-17 MED FILL — LOSARTAN-HCTZ 100-25 MG TAB: 100-25 | 30 days supply | Qty: 30 | Fill #4

## 2019-01-24 MED FILL — METOPROLOL TARTRATE 100 MG: 100 | 90 days supply | Qty: 90 | Fill #1

## 2019-01-25 MED FILL — VERAPAMIL HCL ER 240 MG TBC: 240 | 90 days supply | Qty: 90 | Fill #0

## 2019-02-14 MED FILL — LOSARTAN-HCTZ 100-25 MG TAB: 100-25 | 30 days supply | Qty: 30 | Fill #5

## 2019-03-01 ENCOUNTER — Other Ambulatory Visit: Payer: Self-pay | Admitting: *Deleted

## 2019-03-01 DIAGNOSIS — Z20822 Contact with and (suspected) exposure to covid-19: Secondary | ICD-10-CM

## 2019-03-03 LAB — NOVEL CORONAVIRUS, NAA: SARS-CoV-2, NAA: NOT DETECTED

## 2019-03-10 MED FILL — predniSONE 10 MG TABS: 10 | 6 days supply | Qty: 21 | Fill #0

## 2019-03-10 MED FILL — CEFDINIR 300 MG CAPSULE: 300 | 10 days supply | Qty: 20 | Fill #0

## 2019-03-18 MED FILL — LOSARTAN-HCTZ 100-25 MG TAB: 100-25 | 90 days supply | Qty: 90 | Fill #0

## 2019-04-25 MED FILL — METOPROLOL TARTRATE 100 MG: 100 | 90 days supply | Qty: 90 | Fill #0

## 2019-04-27 MED FILL — VERAPAMIL ER 240 MG TABLET: 240 | 90 days supply | Qty: 90 | Fill #0

## 2019-06-13 MED FILL — LOSARTAN-HCTZ 100-25 MG TAB: 100-25 | 90 days supply | Qty: 90 | Fill #1

## 2019-07-04 ENCOUNTER — Other Ambulatory Visit: Payer: Self-pay | Admitting: Urology

## 2019-07-04 DIAGNOSIS — C61 Malignant neoplasm of prostate: Secondary | ICD-10-CM

## 2019-07-20 MED FILL — VERAPAMIL ER 240 MG TABLET: 240 | 90 days supply | Qty: 90 | Fill #0

## 2019-07-26 MED FILL — METOPROLOL TARTRATE 100 MG: 100 | 90 days supply | Qty: 90 | Fill #1

## 2019-07-29 ENCOUNTER — Ambulatory Visit
Admission: RE | Admit: 2019-07-29 | Discharge: 2019-07-29 | Disposition: A | Discharge status: Home / Self Care | Payer: Medicare Other | Source: Ambulatory Visit | Attending: Urology | Admitting: Urology

## 2019-07-29 ENCOUNTER — Other Ambulatory Visit: Payer: Self-pay

## 2019-07-29 DIAGNOSIS — C61 Malignant neoplasm of prostate: Secondary | ICD-10-CM

## 2019-07-29 MED ORDER — GADOBENATE DIMEGLUMINE 529 MG/ML IV SOLN
20.0000 mL | Freq: Once | INTRAVENOUS | Status: AC | PRN
  Administered 2019-07-29: 15:00:00 20 mL via INTRAVENOUS

## 2019-09-12 MED FILL — LOSARTAN-HCTZ 100-25 MG TAB: 100-25 | 90 days supply | Qty: 90 | Fill #2

## 2019-10-20 MED FILL — VERAPAMIL ER 240 MG TABLET: 240 | 90 days supply | Qty: 90 | Fill #1

## 2019-10-20 MED FILL — METOPROLOL TARTRATE 100 MG: 100 | 90 days supply | Qty: 90 | Fill #0

## 2019-12-05 ENCOUNTER — Other Ambulatory Visit (HOSPITAL_COMMUNITY): Payer: Self-pay | Admitting: Urology

## 2019-12-05 MED FILL — FINASTERIDE 5 MG TABLET: 5 | 90 days supply | Qty: 90 | Fill #0

## 2019-12-09 ENCOUNTER — Other Ambulatory Visit (HOSPITAL_COMMUNITY): Payer: Self-pay | Admitting: Family Medicine

## 2019-12-09 MED FILL — LOSARTAN-HCTZ 100-25 MG TAB: 100-25 | 90 days supply | Qty: 90 | Fill #0

## 2020-01-17 ENCOUNTER — Other Ambulatory Visit (HOSPITAL_COMMUNITY): Payer: Self-pay | Admitting: Family Medicine

## 2020-01-17 MED FILL — VERAPAMIL ER 240 MG TABLET: 240 | 90 days supply | Qty: 90 | Fill #0

## 2020-01-17 MED FILL — METOPROLOL TARTRATE 100 MG: 100 | 90 days supply | Qty: 90 | Fill #1

## 2020-02-28 MED FILL — FINASTERIDE 5 MG TABLET: 5 | 90 days supply | Qty: 90 | Fill #1

## 2020-03-06 MED FILL — LOSARTAN-HCTZ 100-25 MG TAB: 100-25 | 90 days supply | Qty: 90 | Fill #1

## 2020-04-16 ENCOUNTER — Other Ambulatory Visit (HOSPITAL_COMMUNITY): Payer: Self-pay | Admitting: Family Medicine

## 2020-04-16 MED FILL — VERAPAMIL ER 240 MG TABLET: 240 | 90 days supply | Qty: 90 | Fill #1

## 2020-04-16 MED FILL — METOPROLOL TARTRATE 100 MG: 100 | 90 days supply | Qty: 90 | Fill #0

## 2020-05-28 MED FILL — LOSARTAN-HCTZ 100-25 MG TAB: 100-25 | 90 days supply | Qty: 90 | Fill #2

## 2020-05-28 MED FILL — FINASTERIDE 5 MG TABLET: 5 | 90 days supply | Qty: 90 | Fill #2

## 2020-07-10 ENCOUNTER — Other Ambulatory Visit (HOSPITAL_COMMUNITY): Payer: Self-pay | Admitting: Family Medicine

## 2020-07-10 MED FILL — VERAPAMIL ER 240 MG TABLET: 240 | 90 days supply | Qty: 90 | Fill #0

## 2020-07-18 MED FILL — METOPROLOL TARTRATE 100 MG: 100 | 90 days supply | Qty: 90 | Fill #1

## 2020-08-28 MED FILL — LOSARTAN-HCTZ 100-25 MG TAB: 100-25 | 30 days supply | Qty: 30 | Fill #3

## 2020-08-28 MED FILL — FINASTERIDE 5 MG TABLET: 5 | 90 days supply | Qty: 90 | Fill #3

## 2020-09-13 DIAGNOSIS — E7800 Pure hypercholesterolemia, unspecified: Secondary | ICD-10-CM | POA: Diagnosis not present

## 2020-09-13 DIAGNOSIS — I10 Essential (primary) hypertension: Secondary | ICD-10-CM | POA: Diagnosis not present

## 2020-09-25 ENCOUNTER — Other Ambulatory Visit (HOSPITAL_COMMUNITY): Payer: Self-pay

## 2020-09-25 MED FILL — Losartan Potassium & Hydrochlorothiazide Tab 100-25 MG: ORAL | 30 days supply | Qty: 30 | Fill #0 | Status: AC

## 2020-10-08 ENCOUNTER — Other Ambulatory Visit (HOSPITAL_COMMUNITY): Payer: Self-pay

## 2020-10-08 MED FILL — Verapamil HCl Tab ER 240 MG: ORAL | 90 days supply | Qty: 90 | Fill #0 | Status: AC

## 2020-10-10 ENCOUNTER — Other Ambulatory Visit (HOSPITAL_COMMUNITY): Payer: Self-pay

## 2020-10-16 ENCOUNTER — Other Ambulatory Visit (HOSPITAL_COMMUNITY): Payer: Self-pay

## 2020-10-16 DIAGNOSIS — R7309 Other abnormal glucose: Secondary | ICD-10-CM | POA: Diagnosis not present

## 2020-10-16 MED FILL — Metoprolol Tartrate Tab 100 MG: ORAL | 90 days supply | Qty: 90 | Fill #0 | Status: AC

## 2020-10-29 ENCOUNTER — Other Ambulatory Visit (HOSPITAL_COMMUNITY): Payer: Self-pay

## 2020-10-29 MED FILL — Losartan Potassium & Hydrochlorothiazide Tab 100-25 MG: ORAL | 30 days supply | Qty: 30 | Fill #1 | Status: AC

## 2020-11-26 ENCOUNTER — Other Ambulatory Visit (HOSPITAL_COMMUNITY): Payer: Self-pay

## 2020-11-26 ENCOUNTER — Other Ambulatory Visit: Payer: Self-pay | Admitting: Urology

## 2020-11-26 MED ORDER — FINASTERIDE 5 MG PO TABS
5.0000 mg | ORAL_TABLET | Freq: Every day | ORAL | 3 refills | Status: DC
  Filled 2020-11-26: qty 90, 90d supply, fill #0
  Filled 2021-02-22: qty 90, 90d supply, fill #1
  Filled 2021-05-20: qty 90, 90d supply, fill #2
  Filled 2021-08-20: qty 90, 90d supply, fill #3

## 2020-11-26 MED ORDER — LOSARTAN POTASSIUM-HCTZ 100-25 MG PO TABS
1.0000 | ORAL_TABLET | Freq: Every day | ORAL | 1 refills | Status: DC
  Filled 2020-11-26: qty 90, 90d supply, fill #0
  Filled 2021-02-22: qty 90, 90d supply, fill #1

## 2021-01-07 ENCOUNTER — Other Ambulatory Visit (HOSPITAL_COMMUNITY): Payer: Self-pay

## 2021-01-07 MED FILL — Verapamil HCl Tab ER 240 MG: ORAL | 90 days supply | Qty: 90 | Fill #1 | Status: AC

## 2021-01-15 ENCOUNTER — Other Ambulatory Visit (HOSPITAL_COMMUNITY): Payer: Self-pay

## 2021-01-15 MED FILL — Metoprolol Tartrate Tab 100 MG: ORAL | 90 days supply | Qty: 90 | Fill #1 | Status: AC

## 2021-02-22 ENCOUNTER — Other Ambulatory Visit (HOSPITAL_COMMUNITY): Payer: Self-pay

## 2021-04-01 ENCOUNTER — Other Ambulatory Visit (HOSPITAL_COMMUNITY): Payer: Self-pay

## 2021-04-01 MED FILL — Verapamil HCl Tab ER 240 MG: ORAL | 90 days supply | Qty: 90 | Fill #2 | Status: AC

## 2021-04-08 DIAGNOSIS — Z23 Encounter for immunization: Secondary | ICD-10-CM | POA: Diagnosis not present

## 2021-04-08 DIAGNOSIS — R809 Proteinuria, unspecified: Secondary | ICD-10-CM | POA: Diagnosis not present

## 2021-04-08 DIAGNOSIS — I10 Essential (primary) hypertension: Secondary | ICD-10-CM | POA: Diagnosis not present

## 2021-04-08 DIAGNOSIS — R7303 Prediabetes: Secondary | ICD-10-CM | POA: Diagnosis not present

## 2021-04-08 DIAGNOSIS — Z0000 Encounter for general adult medical examination without abnormal findings: Secondary | ICD-10-CM | POA: Diagnosis not present

## 2021-04-08 DIAGNOSIS — E7800 Pure hypercholesterolemia, unspecified: Secondary | ICD-10-CM | POA: Diagnosis not present

## 2021-04-17 ENCOUNTER — Other Ambulatory Visit (HOSPITAL_COMMUNITY): Payer: Self-pay

## 2021-04-18 ENCOUNTER — Other Ambulatory Visit (HOSPITAL_COMMUNITY): Payer: Self-pay

## 2021-04-18 IMAGING — MR MR PROSTATE WO/W CM
56 series · 56 of 56 positions shown · IV contrast (20ml Multihance)
Comparison: 07/17/2017 and multiple prior imaging studies.

CLINICAL DATA: Prostate cancer

EXAM:
MR PROSTATE WITHOUT AND WITH CONTRAST
TECHNIQUE: Multiplanar multisequence MRI images were obtained of the pelvis
centered about the prostate. Pre and post contrast images were
obtained.
CONTRAST:  20mL MULTIHANCE GADOBENATE DIMEGLUMINE 529 MG/ML IV SOLN
Creatinine was obtained on site at [HOSPITAL] at [HOSPITAL].
Results: Creatinine 0.8 mg/dL.

[Series 3: bSSFP fat-sat · axial · 8.0mm · 0.74mm/px · 1 of 28 slices shown]
[im 1/28]
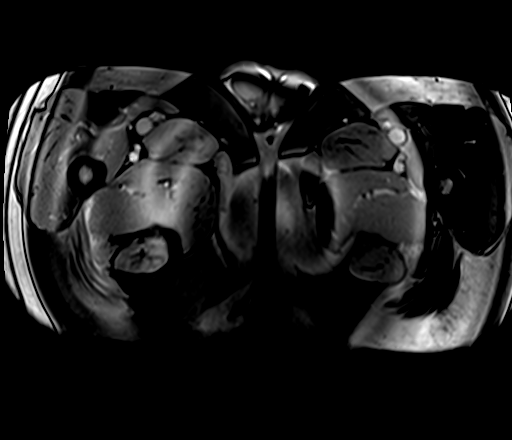

[Series 4: T1 · axial · 5.0mm · 1.25mm/px · 1 of 80 slices shown]
[im 1/80]
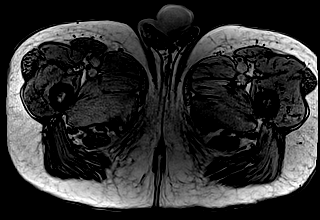

[Series 5: T2 · coronal · 3.5mm · 0.56mm/px · 1 of 24 slices shown (1 of 3)]
[im 1/24]
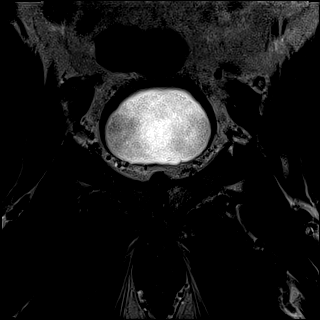

[Series 6: DWI · axial · 3.5mm · 1.75mm/px · 1 of 84 slices shown (1 of 3)]
[im 1/84]
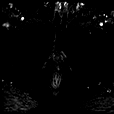

[Series 7: DWI · axial · 3.5mm · 1.75mm/px · 1 of 28 slices shown (2 of 3)]
[im 1/28]
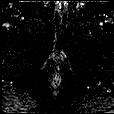

[Series 8: DWI · axial · 3.5mm · 1.56mm/px · 1 of 28 slices shown (3 of 3)]
[im 1/28]
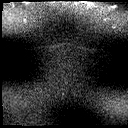

[Series 9: T2 · axial · 3.5mm · 0.56mm/px · 1 of 28 slices shown (2 of 3)]
[im 1/28]
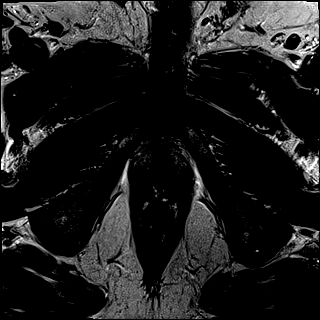

[Series 10: T2 · axial · 1.0mm · 1.04mm/px · 1 of 96 slices shown (3 of 3)]
[im 1/96]
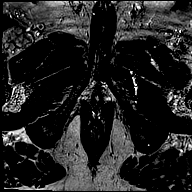

[Series 11: pre t1_twist_tra_dyn_ttc=6.7s · axial · non-contrast · 3.5mm · 0.83mm/px · 1 of 26 slices shown]
[im 1/26]
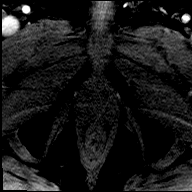

[Series 12: post t1_twist_tra_dyn-copy center · axial · 3.5mm · 0.83mm/px · 1 of 26 slices shown (1 of 24)]
[im 1/26]
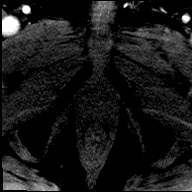

[Series 13: post t1_twist_tra_dyn-copy center · axial · 3.5mm · 0.83mm/px · 1 of 26 slices shown (2 of 24)]
[im 1/26]
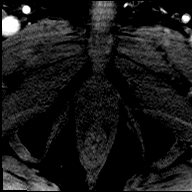

[Series 14: post t1_twist_tra_dyn-copy cent_sub_ttc=(id) · axial · 3.5mm · 0.83mm/px · 1 of 26 slices shown (1 of 23)]
[im 1/26]
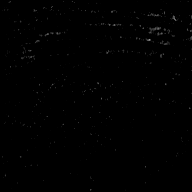

[Series 15: post t1_twist_tra_dyn-copy center · axial · 3.5mm · 0.83mm/px · 1 of 26 slices shown (3 of 24)]
[im 1/26]
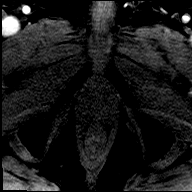

[Series 16: post t1_twist_tra_dyn-copy cent_sub_ttc=(id) · axial · 3.5mm · 0.83mm/px · 1 of 26 slices shown (2 of 23)]
[im 1/26]
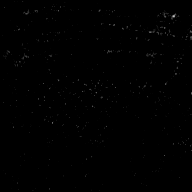

[Series 17: post t1_twist_tra_dyn-copy center · axial · 3.5mm · 0.83mm/px · 1 of 26 slices shown (4 of 24)]
[im 1/26]
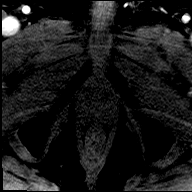

[Series 18: post t1_twist_tra_dyn-copy cent_sub_ttc=(id) · axial · 3.5mm · 0.83mm/px · 1 of 26 slices shown (3 of 23)]
[im 1/26]
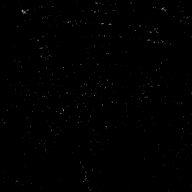

[Series 19: post t1_twist_tra_dyn-copy center · axial · 3.5mm · 0.83mm/px · 1 of 26 slices shown (5 of 24)]
[im 1/26]
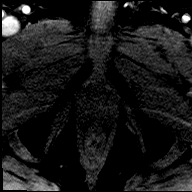

[Series 20: post t1_twist_tra_dyn-copy cent_sub_ttc=(id) · axial · 3.5mm · 0.83mm/px · 1 of 26 slices shown (4 of 23)]
[im 1/26]
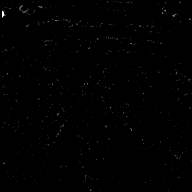

[Series 21: post t1_twist_tra_dyn-copy center · axial · 3.5mm · 0.83mm/px · 1 of 26 slices shown (6 of 24)]
[im 1/26]
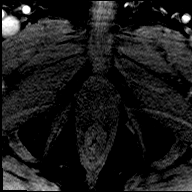

[Series 22: post t1_twist_tra_dyn-copy cent_sub_ttc=(id) · axial · 3.5mm · 0.83mm/px · 1 of 26 slices shown (5 of 23)]
[im 1/26]
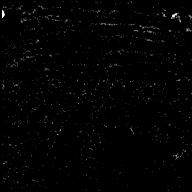

[Series 23: post t1_twist_tra_dyn-copy center · axial · 3.5mm · 0.83mm/px · 1 of 26 slices shown (7 of 24)]
[im 1/26]
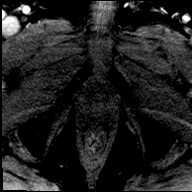

[Series 24: post t1_twist_tra_dyn-copy cent_sub_ttc=(id) · axial · 3.5mm · 0.83mm/px · 1 of 26 slices shown (6 of 23)]
[im 1/26]
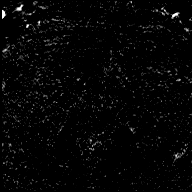

[Series 25: post t1_twist_tra_dyn-copy center · axial · 3.5mm · 0.83mm/px · 1 of 26 slices shown (8 of 24)]
[im 1/26]
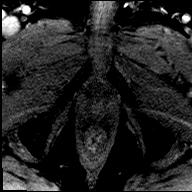

[Series 26: post t1_twist_tra_dyn-copy cent_sub_ttc=(id) · axial · 3.5mm · 0.83mm/px · 1 of 25 slices shown (7 of 23)]
[im 1/25]
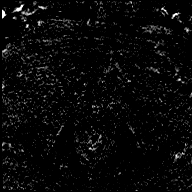

[Series 27: post t1_twist_tra_dyn-copy center · axial · 3.5mm · 0.83mm/px · 1 of 26 slices shown (9 of 24)]
[im 1/26]
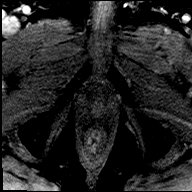

[Series 28: post t1_twist_tra_dyn-copy cent_sub_ttc=(id) · axial · 3.5mm · 0.83mm/px · 1 of 26 slices shown (8 of 23)]
[im 1/26]
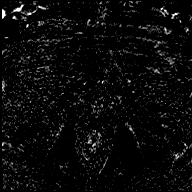

[Series 29: post t1_twist_tra_dyn-copy center · axial · 3.5mm · 0.83mm/px · 1 of 26 slices shown (10 of 24)]
[im 1/26]
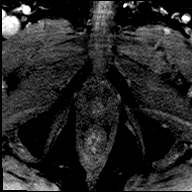

[Series 30: post t1_twist_tra_dyn-copy cent_sub_ttc=(id) · axial · 3.5mm · 0.83mm/px · 1 of 26 slices shown (9 of 23)]
[im 1/26]
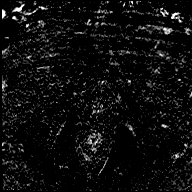

[Series 31: post t1_twist_tra_dyn-copy center · axial · 3.5mm · 0.83mm/px · 1 of 26 slices shown (11 of 24)]
[im 1/26]
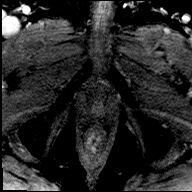

[Series 32: post t1_twist_tra_dyn-copy cent_sub_ttc=(id) · axial · 3.5mm · 0.83mm/px · 1 of 26 slices shown (10 of 23)]
[im 1/26]
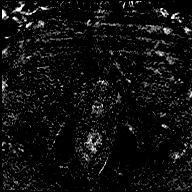

[Series 33: post t1_twist_tra_dyn-copy center · axial · 3.5mm · 0.83mm/px · 1 of 26 slices shown (12 of 24)]
[im 1/26]
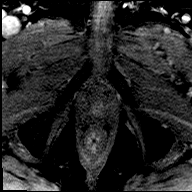

[Series 34: post t1_twist_tra_dyn-copy cent_sub_ttc=(id) · axial · 3.5mm · 0.83mm/px · 1 of 26 slices shown (11 of 23)]
[im 1/26]
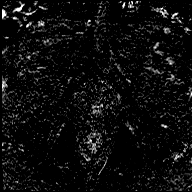

[Series 35: post t1_twist_tra_dyn-copy center · axial · 3.5mm · 0.83mm/px · 1 of 26 slices shown (13 of 24)]
[im 1/26]
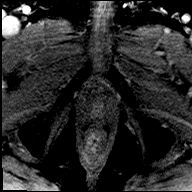

[Series 36: post t1_twist_tra_dyn-copy cent_sub_ttc=(id) · axial · 3.5mm · 0.83mm/px · 1 of 26 slices shown (12 of 23)]
[im 1/26]
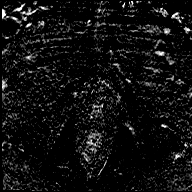

[Series 37: post t1_twist_tra_dyn-copy center · axial · 3.5mm · 0.83mm/px · 1 of 26 slices shown (14 of 24)]
[im 1/26]
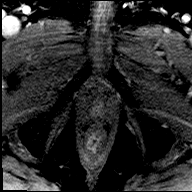

[Series 38: post t1_twist_tra_dyn-copy cent_sub_ttc=(id) · axial · 3.5mm · 0.83mm/px · 1 of 26 slices shown (13 of 23)]
[im 1/26]
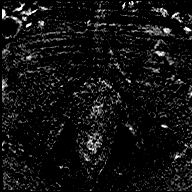

[Series 39: post t1_twist_tra_dyn-copy center · axial · 3.5mm · 0.83mm/px · 1 of 26 slices shown (15 of 24)]
[im 1/26]
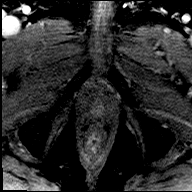

[Series 40: post t1_twist_tra_dyn-copy cent_sub_ttc=(id) · axial · 3.5mm · 0.83mm/px · 1 of 26 slices shown (14 of 23)]
[im 1/26]
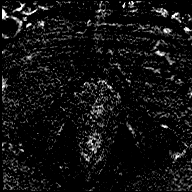

[Series 41: post t1_twist_tra_dyn-copy center · axial · 3.5mm · 0.83mm/px · 1 of 26 slices shown (16 of 24)]
[im 1/26]
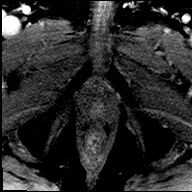

[Series 42: post t1_twist_tra_dyn-copy cent_sub_ttc=(id) · axial · 3.5mm · 0.83mm/px · 1 of 26 slices shown (15 of 23)]
[im 1/26]
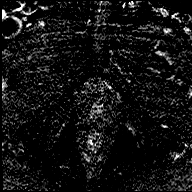

[Series 43: post t1_twist_tra_dyn-copy center · axial · 3.5mm · 0.83mm/px · 1 of 26 slices shown (17 of 24)]
[im 1/26]
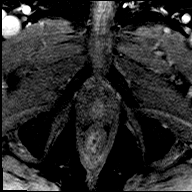

[Series 44: post t1_twist_tra_dyn-copy cent_sub_ttc=(id) · axial · 3.5mm · 0.83mm/px · 1 of 26 slices shown (16 of 23)]
[im 1/26]
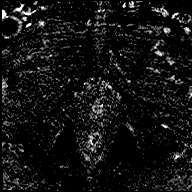

[Series 45: post t1_twist_tra_dyn-copy center · axial · 3.5mm · 0.83mm/px · 1 of 26 slices shown (18 of 24)]
[im 1/26]
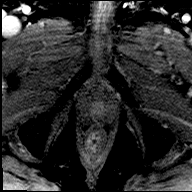

[Series 46: post t1_twist_tra_dyn-copy cent_sub_ttc=(id) · axial · 3.5mm · 0.83mm/px · 1 of 26 slices shown (17 of 23)]
[im 1/26]
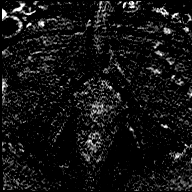

[Series 47: post t1_twist_tra_dyn-copy center · axial · 3.5mm · 0.83mm/px · 1 of 26 slices shown (19 of 24)]
[im 1/26]
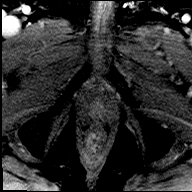

[Series 48: post t1_twist_tra_dyn-copy cent_sub_ttc=(id) · axial · 3.5mm · 0.83mm/px · 1 of 26 slices shown (18 of 23)]
[im 1/26]
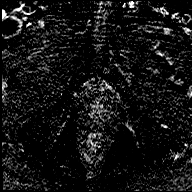

[Series 49: post t1_twist_tra_dyn-copy center · axial · 3.5mm · 0.83mm/px · 1 of 26 slices shown (20 of 24)]
[im 1/26]
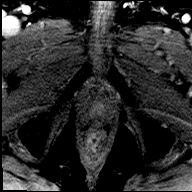

[Series 50: post t1_twist_tra_dyn-copy cent_sub_ttc=(id) · axial · 3.5mm · 0.83mm/px · 1 of 26 slices shown (19 of 23)]
[im 1/26]
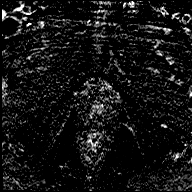

[Series 51: post t1_twist_tra_dyn-copy center · axial · 3.5mm · 0.83mm/px · 1 of 26 slices shown (21 of 24)]
[im 1/26]
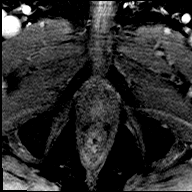

[Series 52: post t1_twist_tra_dyn-copy cent_sub_ttc=(id) · axial · 3.5mm · 0.83mm/px · 1 of 26 slices shown (20 of 23)]
[im 1/26]
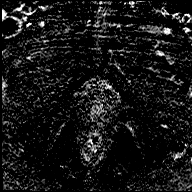

[Series 53: post t1_twist_tra_dyn-copy center · axial · 3.5mm · 0.83mm/px · 1 of 26 slices shown (22 of 24)]
[im 1/26]
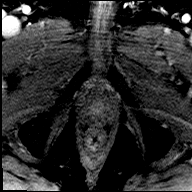

[Series 54: post t1_twist_tra_dyn-copy cent_sub_ttc=(id) · axial · 3.5mm · 0.83mm/px · 1 of 26 slices shown (21 of 23)]
[im 1/26]
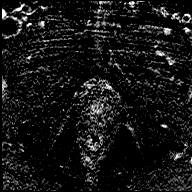

[Series 55: post t1_twist_tra_dyn-copy center · axial · 3.5mm · 0.83mm/px · 1 of 26 slices shown (23 of 24)]
[im 1/26]
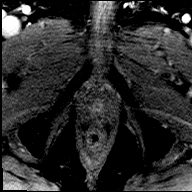

[Series 56: post t1_twist_tra_dyn-copy cent_sub_ttc=(id) · axial · 3.5mm · 0.83mm/px · 1 of 26 slices shown (22 of 23)]
[im 1/26]
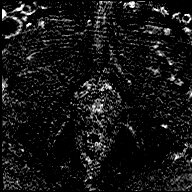

[Series 57: post t1_twist_tra_dyn-copy center · axial · 3.5mm · 0.83mm/px · 1 of 26 slices shown (24 of 24)]
[im 1/26]
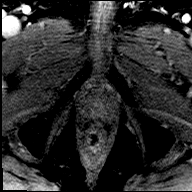

[Series 58: post t1_twist_tra_dyn-copy cent_sub_ttc=(id) · axial · 3.5mm · 0.83mm/px · 1 of 26 slices shown (23 of 23)]
[im 1/26]
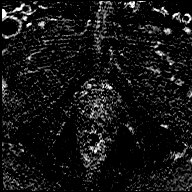

[56 of 56 positions shown; findings below may reference images not displayed]

FINDINGS: Prostate: Signs of BPH and prostatitis. Evidence of hypertrophied
anterior fibromuscular stroma. No areas of marked restricted
diffusion.

Peripheral zone: Signs of prostatitis. No signs of high-risk lesion.

Transitional zone: Area of T2 hypointensity with mild heterogeneity
and obscured margins in the anterior left transitional zone
potentially involving anterior fibromuscular stromaw (image 8,
series series 9w)

No signs of restricted diffusion in this area. This is in the left
mid gland towards the apex at approximately the 1 to 3 o'clock
position.

Similar area at towards the base of the anterior gland measuring 14
mm showing mildly asymmetric signal on diffusion-weighted imaging
(image 15, series 9) lesion does not display signal on high B value
diffusion-weighted images. This is in the left anterior mid to base
approximately the 12 o'clock to 2 o'clock position.

The above areas show some heterogeneous enhancement.

Volume: 150 cc

Transcapsular spread:  Present/absent/other

Seminal vesicle involvement: present/absent/other

Neurovascular bundle involvement: present/absent/other

Pelvic adenopathy: Absent

Bone metastasis: Absent

Other findings: None
IMPRESSION: 1. PIRADS category 3 lesions in the anterior left transitional zone
and or fibromuscular stroma showing asymmetric T2 hypointensity and
1 area with low signal on ADC but without restricted diffusion.
2. No signs of extracapsular extension, nodal disease or suspicious
bone lesion.

## 2021-04-18 MED ORDER — METOPROLOL TARTRATE 100 MG PO TABS
50.0000 mg | ORAL_TABLET | Freq: Two times a day (BID) | ORAL | 3 refills | Status: DC
  Filled 2021-04-18: qty 90, 90d supply, fill #0
  Filled 2021-07-23: qty 90, 90d supply, fill #1
  Filled 2021-10-11: qty 90, 90d supply, fill #2
  Filled 2022-01-10: qty 90, 90d supply, fill #3

## 2021-05-20 ENCOUNTER — Other Ambulatory Visit (HOSPITAL_COMMUNITY): Payer: Self-pay

## 2021-05-21 ENCOUNTER — Other Ambulatory Visit (HOSPITAL_COMMUNITY): Payer: Self-pay

## 2021-05-21 MED ORDER — LOSARTAN POTASSIUM-HCTZ 100-25 MG PO TABS
1.0000 | ORAL_TABLET | Freq: Every day | ORAL | 1 refills | Status: DC
  Filled 2021-05-21: qty 90, 90d supply, fill #0
  Filled 2021-08-22: qty 90, 90d supply, fill #1

## 2021-05-28 DIAGNOSIS — U071 COVID-19: Secondary | ICD-10-CM | POA: Diagnosis not present

## 2021-06-19 DIAGNOSIS — H35372 Puckering of macula, left eye: Secondary | ICD-10-CM | POA: Diagnosis not present

## 2021-06-19 DIAGNOSIS — H43393 Other vitreous opacities, bilateral: Secondary | ICD-10-CM | POA: Diagnosis not present

## 2021-07-01 ENCOUNTER — Other Ambulatory Visit (HOSPITAL_COMMUNITY): Payer: Self-pay

## 2021-07-01 MED ORDER — VERAPAMIL HCL ER 240 MG PO TBCR
240.0000 mg | EXTENDED_RELEASE_TABLET | Freq: Every day | ORAL | 4 refills | Status: DC
  Filled 2021-07-01: qty 90, 90d supply, fill #0
  Filled 2021-09-30: qty 90, 90d supply, fill #1

## 2021-07-23 ENCOUNTER — Other Ambulatory Visit (HOSPITAL_COMMUNITY): Payer: Self-pay

## 2021-08-20 ENCOUNTER — Other Ambulatory Visit (HOSPITAL_COMMUNITY): Payer: Self-pay

## 2021-08-22 ENCOUNTER — Other Ambulatory Visit (HOSPITAL_COMMUNITY): Payer: Self-pay

## 2021-09-30 ENCOUNTER — Other Ambulatory Visit (HOSPITAL_COMMUNITY): Payer: Self-pay

## 2021-10-07 DIAGNOSIS — R7303 Prediabetes: Secondary | ICD-10-CM | POA: Diagnosis not present

## 2021-10-07 DIAGNOSIS — I10 Essential (primary) hypertension: Secondary | ICD-10-CM | POA: Diagnosis not present

## 2021-10-07 DIAGNOSIS — E7800 Pure hypercholesterolemia, unspecified: Secondary | ICD-10-CM | POA: Diagnosis not present

## 2021-10-11 ENCOUNTER — Other Ambulatory Visit (HOSPITAL_COMMUNITY): Payer: Self-pay

## 2021-11-19 ENCOUNTER — Other Ambulatory Visit (HOSPITAL_COMMUNITY): Payer: Self-pay

## 2021-11-19 ENCOUNTER — Other Ambulatory Visit: Payer: Self-pay | Admitting: Urology

## 2021-11-19 MED ORDER — LOSARTAN POTASSIUM-HCTZ 100-25 MG PO TABS
1.0000 | ORAL_TABLET | Freq: Every day | ORAL | 1 refills | Status: DC
  Filled 2021-11-19: qty 90, 90d supply, fill #0

## 2021-11-20 ENCOUNTER — Other Ambulatory Visit (HOSPITAL_COMMUNITY): Payer: Self-pay

## 2021-11-20 MED ORDER — FINASTERIDE 5 MG PO TABS
5.0000 mg | ORAL_TABLET | Freq: Every day | ORAL | 0 refills | Status: DC
  Filled 2021-11-20: qty 90, 90d supply, fill #0

## 2021-12-09 ENCOUNTER — Other Ambulatory Visit: Payer: Self-pay | Admitting: Urology

## 2021-12-09 DIAGNOSIS — C61 Malignant neoplasm of prostate: Secondary | ICD-10-CM

## 2021-12-26 ENCOUNTER — Other Ambulatory Visit: Payer: Medicare Other

## 2021-12-29 ENCOUNTER — Ambulatory Visit
Admission: RE | Admit: 2021-12-29 | Discharge: 2021-12-29 | Disposition: A | Discharge status: Home / Self Care | Payer: Medicare Other | Source: Ambulatory Visit | Attending: Urology | Admitting: Urology

## 2021-12-29 ENCOUNTER — Other Ambulatory Visit (HOSPITAL_COMMUNITY): Payer: Self-pay

## 2021-12-29 DIAGNOSIS — N3289 Other specified disorders of bladder: Secondary | ICD-10-CM | POA: Diagnosis not present

## 2021-12-29 DIAGNOSIS — C61 Malignant neoplasm of prostate: Secondary | ICD-10-CM

## 2021-12-29 MED ORDER — GADOBENATE DIMEGLUMINE 529 MG/ML IV SOLN
20.0000 mL | Freq: Once | INTRAVENOUS | Status: AC | PRN
  Administered 2021-12-29: 20 mL via INTRAVENOUS

## 2021-12-30 ENCOUNTER — Other Ambulatory Visit (HOSPITAL_COMMUNITY): Payer: Self-pay

## 2021-12-31 ENCOUNTER — Other Ambulatory Visit (HOSPITAL_COMMUNITY): Payer: Self-pay

## 2021-12-31 MED ORDER — VERAPAMIL HCL ER 240 MG PO TBCR
240.0000 mg | EXTENDED_RELEASE_TABLET | Freq: Every day | ORAL | 1 refills | Status: DC
  Filled 2021-12-31: qty 90, 90d supply, fill #0
  Filled 2022-03-26: qty 90, 90d supply, fill #1

## 2022-01-02 ENCOUNTER — Other Ambulatory Visit (HOSPITAL_COMMUNITY): Payer: Self-pay

## 2022-01-08 ENCOUNTER — Encounter: Payer: Self-pay | Admitting: Internal Medicine

## 2022-01-10 ENCOUNTER — Other Ambulatory Visit (HOSPITAL_COMMUNITY): Payer: Self-pay

## 2022-02-15 ENCOUNTER — Other Ambulatory Visit (HOSPITAL_COMMUNITY): Payer: Self-pay

## 2022-02-17 ENCOUNTER — Other Ambulatory Visit: Payer: Self-pay | Admitting: Urology

## 2022-02-17 ENCOUNTER — Other Ambulatory Visit (HOSPITAL_COMMUNITY): Payer: Self-pay

## 2022-02-17 MED ORDER — LOSARTAN POTASSIUM-HCTZ 100-25 MG PO TABS
1.0000 | ORAL_TABLET | Freq: Every day | ORAL | 3 refills | Status: DC
  Filled 2022-02-17: qty 90, 90d supply, fill #0
  Filled 2022-05-13: qty 90, 90d supply, fill #1

## 2022-02-19 ENCOUNTER — Other Ambulatory Visit (HOSPITAL_COMMUNITY): Payer: Self-pay

## 2022-02-19 MED ORDER — FINASTERIDE 5 MG PO TABS
5.0000 mg | ORAL_TABLET | Freq: Every day | ORAL | 1 refills | Status: DC
  Filled 2022-02-19: qty 90, 90d supply, fill #0
  Filled 2022-05-13: qty 90, 90d supply, fill #1

## 2022-03-27 ENCOUNTER — Other Ambulatory Visit (HOSPITAL_COMMUNITY): Payer: Self-pay

## 2022-04-14 ENCOUNTER — Other Ambulatory Visit (HOSPITAL_COMMUNITY): Payer: Self-pay

## 2022-04-15 ENCOUNTER — Other Ambulatory Visit (HOSPITAL_COMMUNITY): Payer: Self-pay

## 2022-04-15 MED ORDER — METOPROLOL TARTRATE 100 MG PO TABS
50.0000 mg | ORAL_TABLET | Freq: Two times a day (BID) | ORAL | 0 refills | Status: DC
  Filled 2022-04-15: qty 90, 90d supply, fill #0

## 2022-04-18 DIAGNOSIS — E7800 Pure hypercholesterolemia, unspecified: Secondary | ICD-10-CM | POA: Diagnosis not present

## 2022-04-18 DIAGNOSIS — R809 Proteinuria, unspecified: Secondary | ICD-10-CM | POA: Diagnosis not present

## 2022-04-18 DIAGNOSIS — R7303 Prediabetes: Secondary | ICD-10-CM | POA: Diagnosis not present

## 2022-04-18 DIAGNOSIS — Z0000 Encounter for general adult medical examination without abnormal findings: Secondary | ICD-10-CM | POA: Diagnosis not present

## 2022-04-18 DIAGNOSIS — I10 Essential (primary) hypertension: Secondary | ICD-10-CM | POA: Diagnosis not present

## 2022-04-22 ENCOUNTER — Other Ambulatory Visit: Payer: Self-pay | Admitting: Family Medicine

## 2022-04-22 ENCOUNTER — Other Ambulatory Visit (HOSPITAL_COMMUNITY): Payer: Self-pay

## 2022-04-22 DIAGNOSIS — R809 Proteinuria, unspecified: Secondary | ICD-10-CM

## 2022-04-23 ENCOUNTER — Ambulatory Visit
Admission: RE | Admit: 2022-04-23 | Discharge: 2022-04-23 | Disposition: A | Discharge status: Home / Self Care | Payer: Medicare Other | Source: Ambulatory Visit | Attending: Family Medicine | Admitting: Family Medicine

## 2022-04-23 DIAGNOSIS — R809 Proteinuria, unspecified: Secondary | ICD-10-CM

## 2022-05-14 ENCOUNTER — Other Ambulatory Visit (HOSPITAL_COMMUNITY): Payer: Self-pay

## 2022-06-24 ENCOUNTER — Other Ambulatory Visit (HOSPITAL_COMMUNITY): Payer: Self-pay

## 2022-06-24 MED ORDER — VERAPAMIL HCL ER 240 MG PO TBCR
240.0000 mg | EXTENDED_RELEASE_TABLET | Freq: Every day | ORAL | 1 refills | Status: DC
  Filled 2022-06-24: qty 90, 90d supply, fill #0
  Filled 2022-09-22: qty 90, 90d supply, fill #1

## 2022-07-14 ENCOUNTER — Other Ambulatory Visit (HOSPITAL_COMMUNITY): Payer: Self-pay

## 2022-07-16 ENCOUNTER — Other Ambulatory Visit (HOSPITAL_COMMUNITY): Payer: Self-pay

## 2022-07-16 MED ORDER — METOPROLOL TARTRATE 100 MG PO TABS
50.0000 mg | ORAL_TABLET | Freq: Two times a day (BID) | ORAL | 0 refills | Status: DC
  Filled 2022-07-16: qty 90, 90d supply, fill #0

## 2022-08-05 ENCOUNTER — Other Ambulatory Visit (HOSPITAL_COMMUNITY): Payer: Self-pay

## 2022-08-08 ENCOUNTER — Other Ambulatory Visit (HOSPITAL_COMMUNITY): Payer: Self-pay

## 2022-08-08 MED ORDER — FINASTERIDE 5 MG PO TABS
5.0000 mg | ORAL_TABLET | Freq: Every day | ORAL | 5 refills | Status: DC
  Filled 2022-08-08: qty 90, 90d supply, fill #0
  Filled 2022-11-09: qty 90, 90d supply, fill #1
  Filled 2023-02-05: qty 90, 90d supply, fill #2
  Filled 2023-05-06: qty 90, 90d supply, fill #3
  Filled 2023-08-03: qty 90, 90d supply, fill #4

## 2022-08-11 ENCOUNTER — Other Ambulatory Visit (HOSPITAL_COMMUNITY): Payer: Self-pay

## 2022-08-12 ENCOUNTER — Other Ambulatory Visit (HOSPITAL_COMMUNITY): Payer: Self-pay

## 2022-08-12 MED ORDER — LOSARTAN POTASSIUM-HCTZ 100-25 MG PO TABS
1.0000 | ORAL_TABLET | Freq: Every day | ORAL | 1 refills | Status: DC
  Filled 2022-08-12: qty 90, 90d supply, fill #0
  Filled 2022-11-09: qty 90, 90d supply, fill #1

## 2022-08-14 ENCOUNTER — Other Ambulatory Visit: Payer: Self-pay

## 2022-09-23 ENCOUNTER — Other Ambulatory Visit (HOSPITAL_COMMUNITY): Payer: Self-pay

## 2022-10-13 ENCOUNTER — Other Ambulatory Visit (HOSPITAL_COMMUNITY): Payer: Self-pay

## 2022-10-14 ENCOUNTER — Other Ambulatory Visit (HOSPITAL_COMMUNITY): Payer: Self-pay

## 2022-10-14 MED ORDER — METOPROLOL TARTRATE 100 MG PO TABS
50.0000 mg | ORAL_TABLET | Freq: Two times a day (BID) | ORAL | 0 refills | Status: DC
  Filled 2022-10-14: qty 90, 90d supply, fill #0

## 2022-10-20 DIAGNOSIS — R7303 Prediabetes: Secondary | ICD-10-CM | POA: Diagnosis not present

## 2022-10-20 DIAGNOSIS — I10 Essential (primary) hypertension: Secondary | ICD-10-CM | POA: Diagnosis not present

## 2022-10-20 DIAGNOSIS — R809 Proteinuria, unspecified: Secondary | ICD-10-CM | POA: Diagnosis not present

## 2022-10-20 DIAGNOSIS — E7800 Pure hypercholesterolemia, unspecified: Secondary | ICD-10-CM | POA: Diagnosis not present

## 2022-10-21 ENCOUNTER — Other Ambulatory Visit (HOSPITAL_COMMUNITY): Payer: Self-pay

## 2022-10-21 MED ORDER — DAPAGLIFLOZIN PROPANEDIOL 10 MG PO TABS
10.0000 mg | ORAL_TABLET | Freq: Every day | ORAL | 12 refills | Status: DC
  Filled 2022-10-21: qty 30, 30d supply, fill #0
  Filled 2022-11-24: qty 30, 30d supply, fill #1
  Filled 2023-03-31: qty 30, 30d supply, fill #2
  Filled 2023-05-01: qty 30, 30d supply, fill #3
  Filled 2023-05-31: qty 30, 30d supply, fill #4
  Filled 2023-07-01 – 2023-10-04 (×2): qty 30, 30d supply, fill #5

## 2022-10-24 ENCOUNTER — Other Ambulatory Visit (HOSPITAL_COMMUNITY): Payer: Self-pay

## 2022-11-11 ENCOUNTER — Telehealth: Payer: Self-pay

## 2022-11-11 ENCOUNTER — Other Ambulatory Visit (HOSPITAL_COMMUNITY): Payer: Self-pay

## 2022-11-11 DIAGNOSIS — Z86010 Personal history of colonic polyps: Secondary | ICD-10-CM

## 2022-11-11 MED ORDER — NA SULFATE-K SULFATE-MG SULF 17.5-3.13-1.6 GM/177ML PO SOLN
1.0000 | Freq: Once | ORAL | 0 refills | Status: AC
  Filled 2022-11-11: qty 354, 1d supply, fill #0
  Filled 2022-11-25: qty 354, 2d supply, fill #0

## 2022-11-11 NOTE — Telephone Encounter (Signed)
Patient's med list updated and instructions sent to patient. Suprep sent in and amb referral placed.

## 2022-11-25 ENCOUNTER — Other Ambulatory Visit (HOSPITAL_COMMUNITY): Payer: Self-pay

## 2022-11-25 ENCOUNTER — Telehealth: Payer: Self-pay

## 2022-11-25 NOTE — Telephone Encounter (Signed)
Patient had to r/s his colon, new instructions printed and given to his wife.

## 2022-12-15 ENCOUNTER — Encounter: Payer: Self-pay | Admitting: Internal Medicine

## 2022-12-19 ENCOUNTER — Other Ambulatory Visit (HOSPITAL_COMMUNITY): Payer: Self-pay

## 2022-12-19 ENCOUNTER — Encounter: Payer: Medicare Other | Admitting: Internal Medicine

## 2022-12-19 MED ORDER — VERAPAMIL HCL ER 240 MG PO TBCR
240.0000 mg | EXTENDED_RELEASE_TABLET | Freq: Every day | ORAL | 1 refills | Status: DC
  Filled 2022-12-19: qty 90, 90d supply, fill #0
  Filled 2023-03-18: qty 90, 90d supply, fill #1

## 2022-12-22 ENCOUNTER — Other Ambulatory Visit (HOSPITAL_COMMUNITY): Payer: Self-pay

## 2022-12-22 MED ORDER — JARDIANCE 10 MG PO TABS
10.0000 mg | ORAL_TABLET | Freq: Every day | ORAL | 1 refills | Status: AC
  Filled 2022-12-22 – 2023-01-12 (×2): qty 30, 30d supply, fill #0

## 2022-12-23 ENCOUNTER — Other Ambulatory Visit (HOSPITAL_COMMUNITY): Payer: Self-pay

## 2022-12-24 ENCOUNTER — Other Ambulatory Visit (HOSPITAL_COMMUNITY): Payer: Self-pay

## 2022-12-29 ENCOUNTER — Encounter: Payer: Self-pay | Admitting: Internal Medicine

## 2022-12-29 ENCOUNTER — Ambulatory Visit: Payer: Medicare Other | Admitting: Internal Medicine

## 2022-12-29 VITALS — BP 107/62 | HR 45 | Temp 97.8°F | Resp 18 | Ht 67.5 in | Wt 225.0 lb

## 2022-12-29 DIAGNOSIS — D122 Benign neoplasm of ascending colon: Secondary | ICD-10-CM

## 2022-12-29 DIAGNOSIS — Z86010 Personal history of colonic polyps: Secondary | ICD-10-CM

## 2022-12-29 DIAGNOSIS — D120 Benign neoplasm of cecum: Secondary | ICD-10-CM

## 2022-12-29 DIAGNOSIS — D123 Benign neoplasm of transverse colon: Secondary | ICD-10-CM

## 2022-12-29 DIAGNOSIS — Z09 Encounter for follow-up examination after completed treatment for conditions other than malignant neoplasm: Secondary | ICD-10-CM | POA: Diagnosis not present

## 2022-12-29 DIAGNOSIS — K635 Polyp of colon: Secondary | ICD-10-CM | POA: Diagnosis not present

## 2022-12-29 MED ORDER — SODIUM CHLORIDE 0.9 % IV SOLN
500.00 mL | Freq: Once | INTRAVENOUS | Status: DC
Start: 2022-12-29 — End: 2022-12-29

## 2022-12-29 NOTE — Progress Notes (Signed)
[facility] History and Physical   Primary Care Physician:  Clovis Riley, L.August Saucer, MD   Reason for Procedure:   Hx colon polyps  Plan:    colonoscopy     HPI: Gary Reid is a 72 y.o. male here for surveillance exam  Prior colon polyp hx: 09/13/2009 - 9 adenomas max 15 mm 09/22/2012 - 7 polyps largest 8 mm - mix of tubular adenomas and sessile serrated adenomas 02/2015 - 3 adenomas, 1 ssp and mic of hyperplastic + mucosal polyps 11/29/2018 2 polyps - diminutive ssp   Past Surgical History:  Procedure Laterality Date   COLONOSCOPY W/ POLYPECTOMY     INGUINAL HERNIA REPAIR     PROSTATE BIOPSY     03/2012   TONSILLECTOMY      Prior to Admission medications   Medication Sig Start Date End Date Taking? Authorizing Provider  dapagliflozin propanediol (FARXIGA) 10 MG TABS tablet Take 1 tablet (10 mg total) by mouth daily. 10/21/22  Yes   empagliflozin (JARDIANCE) 10 MG TABS tablet Take 1 tablet (10 mg total) by mouth daily. 12/22/22  Yes   finasteride (PROSCAR) 5 MG tablet Take 1 tablet (5 mg total) by mouth daily. 08/08/22  Yes   fish oil-omega-3 fatty acids 1000 MG capsule Take 1 g by mouth daily.   Yes [provider]  losartan-hydrochlorothiazide (HYZAAR) 100-25 MG tablet Take 1 tablet by mouth daily. 08/12/22  Yes   metoprolol tartrate (LOPRESSOR) 100 MG tablet Take 1/2 tablet (50 mg total) by mouth 2 (two) times daily with food 10/14/22  Yes   Multiple Vitamin (MULTIVITAMIN) tablet Take 1 tablet by mouth daily.   Yes [provider]  verapamil (CALAN-SR) 240 MG CR tablet Take 240 mg by mouth at bedtime.   Yes [provider]  verapamil (CALAN-SR) 240 MG CR tablet Take 1 tablet (240 mg total) by mouth daily. 12/19/22  Yes     Current Outpatient Medications  Medication Sig Dispense Refill   dapagliflozin propanediol (FARXIGA) 10 MG TABS tablet Take 1 tablet (10 mg total) by mouth daily. 30 tablet 12   empagliflozin (JARDIANCE) 10 MG TABS tablet  Take 1 tablet (10 mg total) by mouth daily. 30 tablet 1   finasteride (PROSCAR) 5 MG tablet Take 1 tablet (5 mg total) by mouth daily. 90 tablet 5   fish oil-omega-3 fatty acids 1000 MG capsule Take 1 g by mouth daily.     losartan-hydrochlorothiazide (HYZAAR) 100-25 MG tablet Take 1 tablet by mouth daily. 90 tablet 1   metoprolol tartrate (LOPRESSOR) 100 MG tablet Take 1/2 tablet (50 mg total) by mouth 2 (two) times daily with food 90 tablet 0   Multiple Vitamin (MULTIVITAMIN) tablet Take 1 tablet by mouth daily.     verapamil (CALAN-SR) 240 MG CR tablet Take 240 mg by mouth at bedtime.     verapamil (CALAN-SR) 240 MG CR tablet Take 1 tablet (240 mg total) by mouth daily. 90 tablet 1   Current Facility-Administered Medications  Medication Dose Route Frequency Provider Last Rate Last Admin   0.9 %  sodium chloride infusion  500 mL Intravenous Once Iva Boop, MD        Allergies as of 12/29/2022   (No Known Allergies)    Family History  Adopted: Yes  Problem Relation Age of Onset   Colon cancer Neg Hx    Rectal cancer Neg Hx    Stomach cancer Neg Hx    Esophageal cancer Neg Hx  Social History   Socioeconomic History   Marital status: Married    Spouse name: Not on file   Number of children: Not on file   Years of education: Not on file   Highest education level: Not on file  Occupational History   Not on file  Tobacco Use   Smoking status: Never   Smokeless tobacco: Never  Vaping Use   Vaping Use: Never used  Substance and Sexual Activity   Alcohol use: Not Currently    Alcohol/week: 2.0 standard drinks of alcohol    Types: 1 Glasses of wine, 1 Cans of beer per week   Drug use: No   Sexual activity: Not on file  Other Topics Concern   Not on file  Social History Narrative   Not on file   Social Determinants of Health   Financial Resource Strain: Not on file  Food Insecurity: Not on file  Transportation Needs: Not on file  Physical Activity: Not on  file  Stress: Not on file  Social Connections: Not on file  Intimate Partner Violence: Not on file    Review of Systems:  All other review of systems negative except as mentioned in the HPI.  Physical Exam: Vital signs BP (!) 142/71   Pulse (!) 49   Temp 97.8 F (36.6 C) (Skin)   Resp 16   Ht 5' 7.5" (1.715 m)   Wt 225 lb (102.1 kg)   SpO2 99%   BMI 34.72 kg/m   General:   Alert,  Well-developed, well-nourished, pleasant and cooperative in NAD Lungs:  Clear throughout to auscultation.   Heart:  Regular rate and rhythm; no murmurs, clicks, rubs,  or gallops. Abdomen:  Soft, nontender and nondistended. Normal bowel sounds.   Neuro/Psych:  Alert and cooperative. Normal mood and affect. A and O x 3   @Bharath Bernstein  Sena Slate, MD, [facility] [phone] (pager) 12/29/2022 11:20 AM@

## 2022-12-29 NOTE — Patient Instructions (Addendum)
Please read handouts provided. Continue present medications. Await pathology results.   YOU HAD AN ENDOSCOPIC PROCEDURE TODAY AT THE  ENDOSCOPY CENTER:   Refer to the procedure report that was given to you for any specific questions about what was found during the examination.  If the procedure report does not answer your questions, please call your gastroenterologist to clarify.  If you requested that your care partner not be given the details of your procedure findings, then the procedure report has been included in a sealed envelope for you to review at your convenience later.  YOU SHOULD EXPECT: Some feelings of bloating in the abdomen. Passage of more gas than usual.  Walking can help get rid of the air that was put into your GI tract during the procedure and reduce the bloating. If you had a lower endoscopy (such as a colonoscopy or flexible sigmoidoscopy) you may notice spotting of blood in your stool or on the toilet paper. If you underwent a bowel prep for your procedure, you may not have a normal bowel movement for a few days.  Please Note:  You might notice some irritation and congestion in your nose or some drainage.  This is from the oxygen used during your procedure.  There is no need for concern and it should clear up in a day or so.  SYMPTOMS TO REPORT IMMEDIATELY:  Following lower endoscopy (colonoscopy or flexible sigmoidoscopy):  Excessive amounts of blood in the stool  Significant tenderness or worsening of abdominal pains  Swelling of the abdomen that is new, acute  Fever of 100F or higher  For urgent or emergent issues, a gastroenterologist can be reached at any hour by calling [phone]. Do not use MyChart messaging for urgent concerns.    DIET:  We do recommend a small meal at first, but then you may proceed to your regular diet.  Drink plenty of fluids but you should avoid alcoholic beverages for 24 hours.  ACTIVITY:  You should plan to take it easy for  the rest of today and you should NOT DRIVE or use heavy machinery until tomorrow (because of the sedation medicines used during the test).    FOLLOW UP: Our staff will call the number listed on your records the next business day following your procedure.  We will call around 7:15- 8:00 am to check on you and address any questions or concerns that you may have regarding the information given to you following your procedure. If we do not reach you, we will leave a message.     If any biopsies were taken you will be contacted by phone or by letter within the next 1-3 weeks.  Please call us at [phone] if you have not heard about the biopsies in 3 weeks.    SIGNATURES/CONFIDENTIALITY: You and/or your care partner have signed paperwork which will be entered into your electronic medical record.  These signatures attest to the fact that that the information above on your After Visit Summary has been reviewed and is understood.  Full responsibility of the confidentiality of this discharge information lies with you and/or your care-partner.Eight (8) small polyps removed today - all look benign. One was not recovered (1-2 mm - tiy)  I will let you know pathology results and when to have another routine colonoscopy by mail and/or My Chart.  I appreciate the opportunity to care for you. Iva Boop, MD, [facility]

## 2022-12-29 NOTE — Progress Notes (Signed)
Pt's states no medical or surgical changes since previsit or office visit. 

## 2022-12-29 NOTE — Progress Notes (Signed)
Called to room to assist during endoscopic procedure.  Patient ID and intended procedure confirmed with present staff. Received instructions for my participation in the procedure from the performing physician.  

## 2022-12-29 NOTE — Progress Notes (Signed)
Sedate, gd SR, tolerated procedure well, VSS, report to RN 

## 2022-12-29 NOTE — Op Note (Signed)
[facility] Patient Name: Gary Reid Procedure Date: 12/29/2022 10:43 AM MRN: [id number] Endoscopist: Iva Boop , MD, [id number] Age: 72 Referring MD:  Date of Birth: Mar 31, 1951 Gender: Male Account #: [id number] Procedure:                Colonoscopy Indications:              Surveillance: Personal history of adenomatous                            polyps on last colonoscopy > 3 years ago, Last                            colonoscopy: June 2020 Medicines:                Monitored Anesthesia Care Procedure:                Pre-Anesthesia Assessment:                           - Prior to the procedure, a History and Physical                            was performed, and patient medications and                            allergies were reviewed. The patient's tolerance of                            previous anesthesia was also reviewed. The risks                            and benefits of the procedure and the sedation                            options and risks were discussed with the patient.                            All questions were answered, and informed consent                            was obtained. Prior Anticoagulants: The patient has                            taken no anticoagulant or antiplatelet agents. ASA                            Grade Assessment: II - A patient with mild systemic                            disease. After reviewing the risks and benefits,                            the patient was deemed in satisfactory condition to  undergo the procedure.                           After obtaining informed consent, the colonoscope                            was passed under direct vision. Throughout the                            procedure, the patient's blood pressure, pulse, and                            oxygen saturations were monitored continuously. The                            Olympus Scope [id number] was introduced  through the                            anus and advanced to the the cecum, identified by                            appendiceal orifice and ileocecal valve. The                            colonoscopy was performed without difficulty. The                            patient tolerated the procedure well. The quality                            of the bowel preparation was good. The ileocecal                            valve, appendiceal orifice, and rectum were                            photographed. The bowel preparation used was SUPREP                            via split dose instruction. Scope In: 11:29:42 AM Scope Out: 11:47:08 AM Scope Withdrawal Time: 0 hours 15 minutes 17 seconds  Total Procedure Duration: 0 hours 17 minutes 26 seconds  Findings:                 The perianal and digital rectal examinations                            revealed an external hemorrhoid.                           Eight sessile polyps were found in the transverse                            colon, ascending colon and cecum. The polyps were  diminutive in size. These polyps were removed with                            a cold snare. Resection was complete, but the polyp                            tissue was only partially retrieved. Verification                            of patient identification for the specimen was                            done. Estimated blood loss was minimal.                           The exam was otherwise without abnormality on                            direct and retroflexion views. Complications:            No immediate complications. Estimated Blood Loss:     Estimated blood loss was minimal. Impression:               - Eight diminutive polyps in the transverse colon,                            in the ascending colon and in the cecum, removed                            with a cold snare. Complete resection. Partial                            retrieval.  one 1-2 mm polyp not retrieved.                           - The examination was otherwise normal on direct                            and retroflexion views other than external                            hemorrhoid                           - Personal history of colonic polyps. 09/13/2009 - 9                            adenomas max 15 mm                           09/22/2012 - 7 polyps largest 8 mm - mix of tubular                            adenomas and sessile serrated adenomas  02/2015 - 3 adenomas, 1 ssp and mic of hyperplastic                            + mucosal polyps                           11/29/2018 2 polyps - diminutive ssp Recommendation:           - Patient has a contact number available for                            emergencies. The signs and symptoms of potential                            delayed complications were discussed with the                            patient. Return to normal activities tomorrow.                            Written discharge instructions were provided to the                            patient.                           - Resume previous diet.                           - Continue present medications.                           - Repeat colonoscopy is recommended for                            surveillance. The colonoscopy date will be                            determined after pathology results from today's                            exam become available for review. tabulate lifetime                            # adenomas and ssp's then Iva Boop, MD 12/29/2022 11:54:52 AM This report has been signed electronically.

## 2022-12-30 ENCOUNTER — Telehealth: Payer: Self-pay | Admitting: *Deleted

## 2022-12-30 NOTE — Telephone Encounter (Signed)
  Follow up Call-     12/29/2022   10:49 AM  Call back number  Post procedure Call Back phone  # [phone]  Permission to leave phone message Yes     Patient questions:  Do you have a fever, pain , or abdominal swelling? No. Pain Score  0 *  Have you tolerated food without any problems? Yes.    Have you been able to return to your normal activities? No.  Do you have any questions about your discharge instructions: Diet   No. Medications  No. Follow up visit  No.  Do you have questions or concerns about your Care? No.  Actions: * If pain score is 4 or above: No action needed, pain <4.

## 2022-12-31 ENCOUNTER — Other Ambulatory Visit (HOSPITAL_COMMUNITY): Payer: Self-pay

## 2022-12-31 MED ORDER — DAPAGLIFLOZIN PROPANEDIOL 10 MG PO TABS
10.0000 mg | ORAL_TABLET | Freq: Every day | ORAL | 1 refills | Status: AC
  Filled 2022-12-31: qty 90, 90d supply, fill #0
  Filled 2023-07-07: qty 90, 90d supply, fill #1

## 2023-01-06 ENCOUNTER — Encounter: Payer: Self-pay | Admitting: Internal Medicine

## 2023-01-06 ENCOUNTER — Telehealth: Payer: Self-pay | Admitting: Internal Medicine

## 2023-01-06 DIAGNOSIS — Z86010 Personal history of colonic polyps: Secondary | ICD-10-CM

## 2023-01-06 NOTE — Telephone Encounter (Signed)
Reviewed with the patient and I had discussed this in the past.  He is adopted and does not have any biologic children so we will not do genetic testing.

## 2023-01-12 ENCOUNTER — Other Ambulatory Visit (HOSPITAL_COMMUNITY): Payer: Self-pay

## 2023-01-13 ENCOUNTER — Other Ambulatory Visit (HOSPITAL_COMMUNITY): Payer: Self-pay

## 2023-01-13 MED ORDER — METOPROLOL TARTRATE 100 MG PO TABS
50.0000 mg | ORAL_TABLET | Freq: Two times a day (BID) | ORAL | 1 refills | Status: DC
  Filled 2023-01-13: qty 90, 90d supply, fill #0
  Filled 2023-04-20: qty 90, 90d supply, fill #1

## 2023-02-05 ENCOUNTER — Other Ambulatory Visit: Payer: Self-pay

## 2023-02-05 ENCOUNTER — Other Ambulatory Visit (HOSPITAL_COMMUNITY): Payer: Self-pay

## 2023-02-05 MED ORDER — LOSARTAN POTASSIUM-HCTZ 100-25 MG PO TABS
1.0000 | ORAL_TABLET | Freq: Every day | ORAL | 1 refills | Status: DC
  Filled 2023-02-05: qty 90, 90d supply, fill #0
  Filled 2023-05-06: qty 90, 90d supply, fill #1

## 2023-04-27 DIAGNOSIS — E7800 Pure hypercholesterolemia, unspecified: Secondary | ICD-10-CM | POA: Diagnosis not present

## 2023-04-27 DIAGNOSIS — I10 Essential (primary) hypertension: Secondary | ICD-10-CM | POA: Diagnosis not present

## 2023-04-27 DIAGNOSIS — Z23 Encounter for immunization: Secondary | ICD-10-CM | POA: Diagnosis not present

## 2023-04-27 DIAGNOSIS — R7303 Prediabetes: Secondary | ICD-10-CM | POA: Diagnosis not present

## 2023-04-27 DIAGNOSIS — Z0000 Encounter for general adult medical examination without abnormal findings: Secondary | ICD-10-CM | POA: Diagnosis not present

## 2023-04-27 DIAGNOSIS — R809 Proteinuria, unspecified: Secondary | ICD-10-CM | POA: Diagnosis not present

## 2023-05-07 ENCOUNTER — Other Ambulatory Visit (HOSPITAL_COMMUNITY): Payer: Self-pay

## 2023-06-15 ENCOUNTER — Other Ambulatory Visit (HOSPITAL_COMMUNITY): Payer: Self-pay

## 2023-06-15 MED ORDER — VERAPAMIL HCL ER 240 MG PO TBCR
240.0000 mg | EXTENDED_RELEASE_TABLET | Freq: Every day | ORAL | 1 refills | Status: DC
  Filled 2023-06-15: qty 90, 90d supply, fill #0
  Filled 2023-09-13: qty 90, 90d supply, fill #1

## 2023-07-02 ENCOUNTER — Other Ambulatory Visit (HOSPITAL_COMMUNITY): Payer: Self-pay

## 2023-07-08 ENCOUNTER — Other Ambulatory Visit (HOSPITAL_COMMUNITY): Payer: Self-pay

## 2023-07-19 ENCOUNTER — Other Ambulatory Visit (HOSPITAL_COMMUNITY): Payer: Self-pay

## 2023-07-20 ENCOUNTER — Other Ambulatory Visit (HOSPITAL_COMMUNITY): Payer: Self-pay

## 2023-07-20 MED ORDER — METOPROLOL TARTRATE 100 MG PO TABS
50.0000 mg | ORAL_TABLET | Freq: Two times a day (BID) | ORAL | 1 refills | Status: DC
  Filled 2023-07-20: qty 90, 90d supply, fill #0
  Filled 2023-10-20: qty 90, 90d supply, fill #1

## 2023-07-24 DIAGNOSIS — I129 Hypertensive chronic kidney disease with stage 1 through stage 4 chronic kidney disease, or unspecified chronic kidney disease: Secondary | ICD-10-CM | POA: Diagnosis not present

## 2023-07-24 DIAGNOSIS — R809 Proteinuria, unspecified: Secondary | ICD-10-CM | POA: Diagnosis not present

## 2023-07-24 DIAGNOSIS — R7303 Prediabetes: Secondary | ICD-10-CM | POA: Diagnosis not present

## 2023-07-27 ENCOUNTER — Other Ambulatory Visit (HOSPITAL_COMMUNITY): Payer: Self-pay

## 2023-07-27 MED ORDER — LOSARTAN POTASSIUM-HCTZ 100-25 MG PO TABS
1.0000 | ORAL_TABLET | Freq: Every day | ORAL | 1 refills | Status: DC
Start: 2023-07-27 — End: 2024-02-01
  Filled 2023-07-27: qty 60, 60d supply, fill #0
  Filled 2023-07-27: qty 30, 30d supply, fill #0
  Filled 2023-10-26: qty 90, 90d supply, fill #1

## 2023-10-22 DIAGNOSIS — I10 Essential (primary) hypertension: Secondary | ICD-10-CM | POA: Diagnosis not present

## 2023-10-22 DIAGNOSIS — R7303 Prediabetes: Secondary | ICD-10-CM | POA: Diagnosis not present

## 2023-10-22 DIAGNOSIS — E7800 Pure hypercholesterolemia, unspecified: Secondary | ICD-10-CM | POA: Diagnosis not present

## 2023-10-26 DIAGNOSIS — E7800 Pure hypercholesterolemia, unspecified: Secondary | ICD-10-CM | POA: Diagnosis not present

## 2023-10-26 DIAGNOSIS — R809 Proteinuria, unspecified: Secondary | ICD-10-CM | POA: Diagnosis not present

## 2023-10-26 DIAGNOSIS — R7303 Prediabetes: Secondary | ICD-10-CM | POA: Diagnosis not present

## 2023-10-26 DIAGNOSIS — I10 Essential (primary) hypertension: Secondary | ICD-10-CM | POA: Diagnosis not present

## 2023-10-26 DIAGNOSIS — D582 Other hemoglobinopathies: Secondary | ICD-10-CM | POA: Diagnosis not present

## 2023-11-02 ENCOUNTER — Other Ambulatory Visit (HOSPITAL_COMMUNITY): Payer: Self-pay

## 2023-11-04 ENCOUNTER — Other Ambulatory Visit (HOSPITAL_COMMUNITY): Payer: Self-pay

## 2023-11-04 ENCOUNTER — Other Ambulatory Visit: Payer: Self-pay

## 2023-11-04 MED ORDER — FINASTERIDE 5 MG PO TABS
5.0000 mg | ORAL_TABLET | Freq: Every day | ORAL | 0 refills | Status: DC
Start: 2023-11-04 — End: 2023-12-14
  Filled 2023-11-04: qty 90, 90d supply, fill #0

## 2023-11-05 ENCOUNTER — Other Ambulatory Visit: Payer: Self-pay

## 2023-11-05 ENCOUNTER — Other Ambulatory Visit (HOSPITAL_COMMUNITY): Payer: Self-pay

## 2023-11-23 DIAGNOSIS — I129 Hypertensive chronic kidney disease with stage 1 through stage 4 chronic kidney disease, or unspecified chronic kidney disease: Secondary | ICD-10-CM | POA: Diagnosis not present

## 2023-11-23 DIAGNOSIS — E559 Vitamin D deficiency, unspecified: Secondary | ICD-10-CM | POA: Diagnosis not present

## 2023-11-30 DIAGNOSIS — R809 Proteinuria, unspecified: Secondary | ICD-10-CM | POA: Diagnosis not present

## 2023-11-30 DIAGNOSIS — E559 Vitamin D deficiency, unspecified: Secondary | ICD-10-CM | POA: Diagnosis not present

## 2023-11-30 DIAGNOSIS — N189 Chronic kidney disease, unspecified: Secondary | ICD-10-CM | POA: Diagnosis not present

## 2023-11-30 DIAGNOSIS — I129 Hypertensive chronic kidney disease with stage 1 through stage 4 chronic kidney disease, or unspecified chronic kidney disease: Secondary | ICD-10-CM | POA: Diagnosis not present

## 2023-12-08 ENCOUNTER — Other Ambulatory Visit (HOSPITAL_COMMUNITY): Payer: Self-pay

## 2023-12-08 MED ORDER — VERAPAMIL HCL ER 240 MG PO TBCR
240.0000 mg | EXTENDED_RELEASE_TABLET | Freq: Every day | ORAL | 1 refills | Status: DC
  Filled 2023-12-08: qty 90, 90d supply, fill #0
  Filled 2024-03-09: qty 90, 90d supply, fill #1

## 2023-12-11 ENCOUNTER — Other Ambulatory Visit (HOSPITAL_COMMUNITY): Payer: Self-pay

## 2023-12-14 ENCOUNTER — Other Ambulatory Visit (HOSPITAL_COMMUNITY): Payer: Self-pay

## 2023-12-14 MED ORDER — FINASTERIDE 5 MG PO TABS
5.0000 mg | ORAL_TABLET | Freq: Every day | ORAL | 3 refills | Status: AC
  Filled 2024-02-01: qty 90, 90d supply, fill #0
  Filled 2024-05-01: qty 90, 90d supply, fill #1
  Filled 2024-07-20: qty 90, 90d supply, fill #2

## 2023-12-17 ENCOUNTER — Other Ambulatory Visit: Payer: Self-pay | Admitting: Urology

## 2023-12-17 DIAGNOSIS — N400 Enlarged prostate without lower urinary tract symptoms: Secondary | ICD-10-CM

## 2023-12-17 DIAGNOSIS — C61 Malignant neoplasm of prostate: Secondary | ICD-10-CM

## 2024-01-15 ENCOUNTER — Other Ambulatory Visit (HOSPITAL_COMMUNITY): Payer: Self-pay

## 2024-01-18 ENCOUNTER — Other Ambulatory Visit (HOSPITAL_COMMUNITY): Payer: Self-pay

## 2024-01-18 MED ORDER — METOPROLOL TARTRATE 100 MG PO TABS
50.0000 mg | ORAL_TABLET | Freq: Two times a day (BID) | ORAL | 1 refills | Status: DC
  Filled 2024-01-18: qty 90, 90d supply, fill #0
  Filled 2024-04-09: qty 90, 90d supply, fill #1

## 2024-01-22 ENCOUNTER — Ambulatory Visit
Admission: RE | Admit: 2024-01-22 | Discharge: 2024-01-22 | Disposition: A | Discharge status: Home / Self Care | Source: Ambulatory Visit | Attending: Urology | Admitting: Urology

## 2024-01-22 DIAGNOSIS — N400 Enlarged prostate without lower urinary tract symptoms: Secondary | ICD-10-CM

## 2024-01-22 DIAGNOSIS — C61 Malignant neoplasm of prostate: Secondary | ICD-10-CM

## 2024-01-22 MED ORDER — GADOPICLENOL 0.5 MMOL/ML IV SOLN
10.0000 mL | Freq: Once | INTRAVENOUS | Status: AC | PRN
  Administered 2024-01-22: 10 mL via INTRAVENOUS

## 2024-02-01 ENCOUNTER — Other Ambulatory Visit: Payer: Self-pay

## 2024-02-01 ENCOUNTER — Other Ambulatory Visit (HOSPITAL_COMMUNITY): Payer: Self-pay

## 2024-02-01 MED ORDER — LOSARTAN POTASSIUM-HCTZ 100-25 MG PO TABS
1.0000 | ORAL_TABLET | Freq: Every day | ORAL | 1 refills | Status: AC
  Filled 2024-02-01: qty 90, 90d supply, fill #0
  Filled 2024-05-01: qty 90, 90d supply, fill #1

## 2024-03-07 ENCOUNTER — Other Ambulatory Visit (HOSPITAL_COMMUNITY): Payer: Self-pay

## 2024-03-07 DIAGNOSIS — N189 Chronic kidney disease, unspecified: Secondary | ICD-10-CM | POA: Diagnosis not present

## 2024-03-07 DIAGNOSIS — E559 Vitamin D deficiency, unspecified: Secondary | ICD-10-CM | POA: Diagnosis not present

## 2024-03-07 DIAGNOSIS — R809 Proteinuria, unspecified: Secondary | ICD-10-CM | POA: Diagnosis not present

## 2024-03-07 DIAGNOSIS — I129 Hypertensive chronic kidney disease with stage 1 through stage 4 chronic kidney disease, or unspecified chronic kidney disease: Secondary | ICD-10-CM | POA: Diagnosis not present

## 2024-03-09 ENCOUNTER — Other Ambulatory Visit (HOSPITAL_COMMUNITY): Payer: Self-pay

## 2024-03-09 MED ORDER — DAPAGLIFLOZIN PROPANEDIOL 10 MG PO TABS
10.0000 mg | ORAL_TABLET | Freq: Every day | ORAL | 3 refills | Status: AC
  Filled 2024-03-09: qty 30, 30d supply, fill #0
  Filled 2024-04-07: qty 30, 30d supply, fill #1
  Filled 2024-05-07: qty 30, 30d supply, fill #2
  Filled 2024-06-12: qty 30, 30d supply, fill #3
  Filled 2024-07-13: qty 30, 30d supply, fill #4

## 2024-06-07 ENCOUNTER — Other Ambulatory Visit (HOSPITAL_COMMUNITY): Payer: Self-pay

## 2024-06-07 MED ORDER — VERAPAMIL HCL ER 240 MG PO TBCR
240.0000 mg | EXTENDED_RELEASE_TABLET | Freq: Every day | ORAL | 1 refills | Status: AC
  Filled 2024-06-07: qty 90, 90d supply, fill #0

## 2024-07-05 ENCOUNTER — Other Ambulatory Visit (HOSPITAL_COMMUNITY): Payer: Self-pay

## 2024-07-06 ENCOUNTER — Other Ambulatory Visit (HOSPITAL_COMMUNITY): Payer: Self-pay

## 2024-07-06 MED ORDER — METOPROLOL TARTRATE 100 MG PO TABS
50.0000 mg | ORAL_TABLET | Freq: Two times a day (BID) | ORAL | 1 refills | Status: AC
  Filled 2024-07-06: qty 90, 90d supply, fill #0

## 2024-07-13 ENCOUNTER — Other Ambulatory Visit (HOSPITAL_COMMUNITY): Payer: Self-pay

## 2024-07-14 ENCOUNTER — Other Ambulatory Visit (HOSPITAL_COMMUNITY): Payer: Self-pay
